# Patient Record
Sex: Male | Born: 1955 | Race: White | Hispanic: No | Marital: Married | Smoking: Never smoker
Health system: Southern US, Community
[De-identification: ages and names within clinical notes are randomized; demographics above are authoritative.]

## PROBLEM LIST (undated history)

## (undated) DIAGNOSIS — I2510 Atherosclerotic heart disease of native coronary artery without angina pectoris: Secondary | ICD-10-CM

## (undated) DIAGNOSIS — Z951 Presence of aortocoronary bypass graft: Secondary | ICD-10-CM

## (undated) DIAGNOSIS — I499 Cardiac arrhythmia, unspecified: Secondary | ICD-10-CM

## (undated) DIAGNOSIS — I10 Essential (primary) hypertension: Secondary | ICD-10-CM

## (undated) DIAGNOSIS — E785 Hyperlipidemia, unspecified: Secondary | ICD-10-CM

## (undated) DIAGNOSIS — E669 Obesity, unspecified: Secondary | ICD-10-CM

## (undated) HISTORY — DX: Obesity, unspecified: E66.9

## (undated) HISTORY — PX: CORONARY ARTERY BYPASS GRAFT: SHX141

## (undated) HISTORY — DX: Hyperlipidemia, unspecified: E78.5

## (undated) HISTORY — DX: Essential (primary) hypertension: I10

## (undated) HISTORY — DX: Cardiac arrhythmia, unspecified: I49.9

## (undated) HISTORY — PX: CORONARY ANGIOPLASTY: SHX604

## (undated) HISTORY — DX: Atherosclerotic heart disease of native coronary artery without angina pectoris: I25.10

---

## 2002-05-02 ENCOUNTER — Encounter: Payer: Self-pay | Admitting: Thoracic Surgery (Cardiothoracic Vascular Surgery)

## 2002-05-02 ENCOUNTER — Inpatient Hospital Stay (HOSPITAL_COMMUNITY): Admission: RE | Admit: 2002-05-02 | Discharge: 2002-05-07 | Payer: Self-pay | Admitting: Cardiovascular Disease

## 2002-05-03 ENCOUNTER — Encounter: Payer: Self-pay | Admitting: Thoracic Surgery (Cardiothoracic Vascular Surgery)

## 2002-05-04 ENCOUNTER — Encounter: Payer: Self-pay | Admitting: Thoracic Surgery (Cardiothoracic Vascular Surgery)

## 2009-01-23 ENCOUNTER — Observation Stay (HOSPITAL_COMMUNITY): Admission: AD | Admit: 2009-01-23 | Discharge: 2009-01-24 | Payer: Self-pay | Admitting: Cardiovascular Disease

## 2010-10-31 LAB — BASIC METABOLIC PANEL
BUN: 8 mg/dL (ref 6–23)
CO2: 28 mEq/L (ref 19–32)
Calcium: 9.2 mg/dL (ref 8.4–10.5)
Chloride: 99 mEq/L (ref 96–112)
Creatinine, Ser: 1.06 mg/dL (ref 0.4–1.5)
GFR calc Af Amer: >60 mL/min (ref >60)
GFR calc non Af Amer: >60 mL/min (ref >60)
Glucose, Bld: 94 mg/dL (ref 70–99)
Potassium: 4.2 mEq/L (ref 3.5–5.1)
Sodium: 137 mEq/L (ref 135–145)

## 2010-10-31 LAB — CBC
HCT: 46.6 % (ref 39.0–52.0)
Hemoglobin: 15.9 g/dL (ref 13.0–17.0)
MCHC: 34.2 g/dL (ref 30.0–36.0)
MCV: 92.8 fL (ref 78.0–100.0)
Platelets: 239 10*3/uL (ref 150–400)
RBC: 5.02 MIL/uL (ref 4.22–5.81)
RDW: 13.8 % (ref 11.5–15.5)
WBC: 8.4 10*3/uL (ref 4.0–10.5)

## 2010-11-19 ENCOUNTER — Other Ambulatory Visit: Payer: Self-pay | Admitting: *Deleted

## 2010-11-19 MED ORDER — METOPROLOL TARTRATE 50 MG PO TABS: ORAL_TABLET | ORAL | Status: DC

## 2010-11-19 NOTE — Telephone Encounter (Signed)
escribe medication per fax request  

## 2010-12-07 NOTE — H&P (Signed)
Lee White, Lee White                 ACCOUNT NO.:  [id number]   MEDICAL RECORD NO.:  [id number]          PATIENT TYPE:  OIB   LOCATION:                               FACILITY:  [facility]   PHYSICIAN:  Vesta Mixer, M.D. DATE OF BIRTH:  02/01/1956   DATE OF ADMISSION:  01/23/2009  DATE OF DISCHARGE:                              HISTORY & PHYSICAL   Lee White is a middle-aged gentleman with a history of coronary artery  disease.  He is status post coronary artery bypass grafting.  He is now  admitted to the hospital with episodes of chest discomfort and an  abnormal stress Cardiolite study.   Lee White has a history of coronary artery disease.  We performed a heart  catheterization on him in 2003.  He was found to have significant three-  vessel coronary artery disease and had coronary artery bypass grafting.   He presented to the office last week with episodes of chest pain and  chest fullness.  He complains of 3 weeks of intermittent episodes of  chest discomfort.  These episodes of chest discomfort seemed to be worse  with exertion and better with rest.  There is radiation out into both  arms.  The pain lasts for a few seconds and several minutes.  He has  also noted some increased shortness of breath.  He picked up bales of  hay over the past weekend and had lots of problems with that.  He has  not had any diaphoresis.  There is no syncope or presyncope.  He denies  any nausea or vomiting.  He denies any PND or orthopnea.  He denies any  heat or cold intolerance, weight gain or weight loss.   CURRENT MEDICATIONS:  Lopressor 25 mg a day, aspirin 325 mg a day,  Crestor 10 mg a day.   ALLERGIES:  None.   PAST MEDICAL HISTORY:  1. Status post coronary artery bypass grafting.  He had significant      three-vessel coronary artery disease.  2. Hyperlipidemia.  3. Hypertension.   SOCIAL HISTORY:  The patient is a nonsmoker.  He used to drink a lot of  alcohol but has not had anything  to drink in many years.  He is employed  as a Naval architect.   FAMILY HISTORY:  Father died at age 31 due to cancer.  His mother had a  CVA.   REVIEW OF SYSTEMS:  Reviewed in the HPI.  All other systems were  reviewed and are negative.   PHYSICAL EXAMINATION:  GENERAL:  He is a middle-aged gentleman in no  acute distress.  He is alert and oriented x3 and his mood and affect are  normal.  VITAL SIGNS:  His weight is 292.  His blood pressure is 134/84 with  heart rate of 78.  NECK:  Carotids, 2+.  He has no bruits, no JVD, no thyromegaly.  His  neck is supple.  HEENT:  Sclerae are nonicteric.  His mucous membranes are moist.  LUNGS:  Clear.  BACK:  Nontender.  HEART:  Regular rate.  S1 and S2.  His PMI is nondisplaced.  ABDOMEN:  Good bowel sounds.  There is no hepatosplenomegaly.  He is  moderately obese.  EXTREMITIES:  He has no clubbing, cyanosis, or edema.  His distal pulses  are 1+.  There is no rash or skin nodules.  There are no palpable cords.  His gait is normal.  NEUROLOGIC:  Nonfocal.   Lee White presents with episodes of chest discomfort.  He recently had a  stress Cardiolite study, which reveals lateral ischemia.  I have  recommended we proceed with heart catheterization with possible  angioplasty.  We have discussed the risks, benefits, and options of  heart catheterization.  He understands and agrees to proceed.      Vesta Mixer, M.D.  Electronically Signed     PJN/MEDQ  D:  01/19/2009  T:  01/20/2009  Job:  [id number]   cc:   Kathlee Nations

## 2010-12-07 NOTE — Cardiovascular Report (Signed)
NAMEDASAN, HARDMAN NO.:  [id number]   MEDICAL RECORD NO.:  [id number]          PATIENT TYPE:  INP   LOCATION:  2507                         FACILITY:  [facility]   PHYSICIAN:  Vesta Mixer, M.D. DATE OF BIRTH:  04-17-1956   DATE OF PROCEDURE:  01/23/2009  DATE OF DISCHARGE:                            CARDIAC CATHETERIZATION   Lee White is a 55 year old gentleman with a history of coronary artery  disease.  He is status post coronary artery bypass grafting.  He  recently presented with some episodes of chest discomfort.  A stress  Cardiolite study revealed a large lateral defect.  He was referred for  cardiac catheterization with possible PTCA for further evaluation.   The procedure was left heart catheterization with coronary angiography.   The right femoral artery was easily cannulated using modified Seldinger  technique.   HEMODYNAMICS:  LV pressure was 180/20.  The aortic pressure was 160/95.   Of note, it was not very difficult to cross the aortic valve.  We  measured a gradient of 20, although I am not sure that this is entirely  true.  We will need to relook of this with echocardiogram.   ANGIOGRAPHY:  Left main:  The left main is subtotal.  He had a 95%  stenosis at its original cath in 2003.  We were not able to fully engage  this, but visualized it with nonselective injections.   The right coronary is small.  It is occluded at its midpoint.   The saphenous vein graft to the right coronary artery is a nice graft.  The anastomosis is normal.  There was brisk flow down the right coronary  artery to the posterior descending artery.   The saphenous vein graft to the obtuse marginal artery #1 and then to  the obtuse marginal artery #2, has a 99% subtotal stenosis just at  anastomosis to the first obtuse marginal.  There was sluggish flow down  the distal aspect of the graft.   The left internal mammary artery was normal.  The anastomosis to the  LAD  is normal.  The distal LAD has minor luminal irregularities.   The left ventriculogram was performed in 30-degree RAO position.  It  reveals mild left ventricular enlargement.  There is mild left  ventricular dysfunction with an ejection fraction of 45-50%.   PCI.  The patient was given Angiomax.  The ACT was 302.  A 6-French  Judkins right 4 guide was positioned and engaged in the saphenous vein  graft to the obtuse marginal artery.  A Prowater angioplasty was placed  down the saphenous vein graft to the distal obtuse marginal artery.   Predilatation was achieved using a 3.0 x 15-mm apex.  We inflated it up  to 6 atmospheres for 18 seconds and then 8 atmospheres for 23 seconds.  Following this, a 3.0 x 15-mm XIENCE stent was positioned across the  stenosis and deployed at 12 atmospheres for 37 seconds.   Poststent dilatation was achieved using a 3.25 x 12-mm  Voyager.  It  was inflated up to 12  atmospheres for 37 seconds in the distal aspect of  the stent.  It was then pulled back to the middle of stent inflated up  to 14 atmospheres for 17 seconds.  This gave [place] a very nice angiographic  result with good apposition of the stent wall.   COMPLICATIONS:  None.   CONCLUSION:  1. Successful PTCA and stenting of the saphenous vein graft to the      obtuse marginal artery.  2. Mild left ventricular dysfunction.      Vesta Mixer, M.D.  Electronically Signed     PJN/MEDQ  D:  01/23/2009  T:  01/24/2009  Job:  [id number]

## 2010-12-10 NOTE — Discharge Summary (Signed)
Lee White, Lee White                           ACCOUNT NO.:  [id number]   MEDICAL RECORD NO.:  [id number]                   PATIENT TYPE:  INP   LOCATION:  2039                                 FACILITY:  [facility]   PHYSICIAN:  Salvatore Decent. Dorris Fetch, M.D.         DATE OF BIRTH:  04-Nov-1955   DATE OF ADMISSION:  05/02/2002  DATE OF DISCHARGE:  05/07/2002                                 DISCHARGE SUMMARY   ADMISSION DIAGNOSIS:  Coronary artery disease.   ADMISSION SECONDARY DIAGNOSES:  1. Hypertension.  2. Postoperative atrial fibrillation.   DISCHARGE DIAGNOSIS:  Coronary artery disease.   HOSPITAL COURSE:  [name] was admitted to [facility] on May 02, 2002, after undergoing emergent cardiac catheterization.  The patient was  found to have significant coronary artery disease.  Because of this, Dr.  Dorris Fetch was consulted and he took the patient emergently to the OR on  May 02, 2002, and performed a coronary bypass graft x4 with left internal  mammary artery anastomosis to left ascending artery, sequential saphenous  vein graft to the first and second obtuse marginal arteries, saphenous vein  graft  to the  posterior descending artery.  No complications were noted  during the procedure.  Postoperatively the patient's hospital course was  complicated by atrial fibrillation.  He was started on Cardizem and  amiodarone.  He converted back to normal sinus rhythm and did not require  any anticoagulation secondary to his atrial fibrillation.  He maintained in  normal sinus rhythm throughout the remainder of his postoperative course.  He had mild postop anemia secondary to blood loss which he tolerated well  and no transfusion was required.  His hemoglobin and hematocrit stabilized  at 10.9 and 33.1 with BUN and creatinine of 11 and 1.1.  He was subsequently  deemed stable for discharge home on postoperative day five, May 07, 2002.   MEDICATIONS AT THE TIME OF  DISCHARGE:  1. Ultram 50 mg one to two tabs every four to six hours as needed for pain.  2. Cardizem 240 mg one daily.  3. Amiodarone 200 mg one tablet twice daily.  4. Lanoxin 0.125 mg one daily.  5. Lopressor 50 mg one-half tablet every 12 hours.   ACTIVITY:  The patient was told no driving, strenuous activity, other than  the obvious case.  Told to walk daily and continue breathing exercises.   DISCHARGE DIET:  Low-fat, low-salt.   WOUND CARE:  The patient was told he could shower and clean his incisions  with soap and water.   DISPOSITION:  Home.    FOLLOW UP:  The patient was told to call his Cardiologist for two week  appointment date.  In addition, he is told to see Dr. Dorris Fetch at [facility]  office in three weeks.  The office will call him to verify time and date of  his appointment.  Nat Math, P.A.                      Salvatore Decent Dorris Fetch, M.D.    Henrene Hawking  D:  06/07/2002  T:  06/07/2002  Job:  [id number]   cc:   Vesta Mixer, M.D.   [facility] Office

## 2010-12-10 NOTE — Cardiovascular Report (Signed)
Lee White, Lee White                           ACCOUNT NO.:  [id number]   MEDICAL RECORD NO.:  [id number]                   PATIENT TYPE:  OIB   LOCATION:  2899                                 FACILITY:  [facility]   PHYSICIAN:  Vesta Mixer, M.D.              DATE OF BIRTH:  1955/11/18   DATE OF PROCEDURE:  05/02/2002  DATE OF DISCHARGE:                              CARDIAC CATHETERIZATION   INDICATIONS:  The patient is a 55 year old gentleman who presented to the  office yesterday with a history of hypertension and unstable angina. He was  referred for heart catheterization. His resting ECG reveals inferior T waves  consistent with a previous inferior wall myocardial infarction.   PROCEDURE:  Left heart catheterization with coronary angiography.   DESCRIPTION OF PROCEDURE:  The right femoral artery was easily cannulated  using the modified Seldinger technique.   HEMODYNAMICS:  The left ventricular pressure was 128/16, with an aortic  pressure 135/82.   ANGIOGRAPHY:  His left main angiography reveals a 99% stenosis at the  ostium. There was TIMI grade 2 flow down the LAD.  There was dampening of  the 6 [place] catheter.   The left anterior descending artery was seen tightly.  It appears to be a  moderate sized vessel.  There is a 70% stenosis in the proximal aspect of  the LAD. There is a medium sized diagonal which appears to be fairly patent  and appears to be a good target for bypass grafting.   The left circumflex artery is a fairly large vessel and is probably dominant  or codominant.  There are moderate irregularities in the circumflex artery.  There is a 50-60% stenosis followed by a 70% stenosis just prior to takeoff  of a posterior descending artery.  There is a moderate sized first obtuse  marginal artery which has only minor luminal irregularities.   The right coronary artery is occluded at its midpoint. The distal right  coronary artery was not visualized.   The  left subclavian artery is widely patent.  The left internal mammary  artery is patent.   LEFT VENTRICULOGRAM:  The left ventriculogram reveals overall well preserved  left ventricular systolic function.  He has akinesis of the basilar and mid  inferior walls.  The ejection fraction is approximately 55%.  The left  ventricular internal cavity has a spade shape, which is consistent with  hypertensive heart disease.  His left ventricular systolic function is  overall well preserved.   COMPLICATIONS:  None.   The patient started having some chest pain following the heart  catheterization. He was given IV labetalol and sublingual nitroglycerin.  His pain subsided.  We will admit him to the hospital and place him in CCU.  We will consult [facility] for consideration for coronary artery bypass grafting.  We will start him on some IV nitroglycerin.  Vesta Mixer, M.D.    PJN/MEDQ  D:  05/02/2002  T:  05/05/2002  Job:  [id number]   cc:   [facility] Office   Cardiac Catheterization Laboratory

## 2010-12-10 NOTE — Discharge Summary (Signed)
NAMECLAYBORNE, DIVIS                 ACCOUNT NO.:  [id number]   MEDICAL RECORD NO.:  [id number]          PATIENT TYPE:  INP   LOCATION:  2507                         FACILITY:  [facility]   PHYSICIAN:  Vesta Mixer, M.D. DATE OF BIRTH:  07/09/1956   DATE OF ADMISSION:  01/23/2009  DATE OF DISCHARGE:  01/24/2009                               DISCHARGE SUMMARY   DISCHARGE DIAGNOSES:  1. Coronary artery disease - status post coronary artery bypass      grafting with percutaneous transluminal coronary angioplasty and      stenting of the saphenous vein graft to the right coronary artery.  2. Hyperlipidemia.  3. Hypertension.   DISCHARGE MEDICATIONS:  1. Lopressor 25 mg twice a day.  2. Aspirin 81 mg a day.  3. Crestor 10 mg a day.  4. Plavix 75 mg a day.  5. Nitroglycerin as needed.   DISPOSITION:  The patient will see Dr. Elease Hashimoto in 1-2 weeks for followup  visit.   HISTORY:  Mr. Lee White is a middle-aged gentleman with a history of  coronary artery disease.  He is status post coronary artery bypass  grafting.  He was admitted to the hospital with episodes of chest pain  and an abnormal stress Cardiolite study.   HOSPITAL COURSE:  Chest pain.  The patient underwent heart  catheterization.  He was found to have a subtotal left main stenosis.  This stenosis has progressed from his original heart catheterization.   The right coronary artery is small.  It is occluded at its midpoint.  The saphenous vein graft to the posterior descending artery is a nice  graft.  The anastomosis is normal.  The saphenous vein graft to the  first and second obtuse marginal artery has a 99% stenosis in the  proximal segment.   The left internal mammary artery and the LAD is normal.   The left ventriculogram revealed mild left ventricular enlargement.  His  left ventricular systolic function was fairly well preserved.   The patient underwent PTCA and stenting of the saphenous vein graft to  the obtuse  marginal artery.  We placed a 3.0 x 15 mm Xience stent.  It  was deployed at 12 atmospheres.  Post-stent dilatation was achieved  using a 3.25-mm [place] Voyager, inflated up to 14 atmospheres.  The patient  did well and did not have any complications.  The patient was discharged  on the following day in stable condition.  All of his other medical  problems are stable.      Vesta Mixer, M.D.  Electronically Signed     Vesta Mixer, M.D.  Electronically Signed    PJN/MEDQ  D:  02/13/2009  T:  02/14/2009  Job:  [id number]

## 2010-12-10 NOTE — Op Note (Signed)
Lee White, Lee White                           ACCOUNT NO.:  [id number]   MEDICAL RECORD NO.:  [id number]                   PATIENT TYPE:  OIB   LOCATION:  2308                                 FACILITY:  [facility]   PHYSICIAN:  Salvatore Decent. Dorris Fetch, M.D.         DATE OF BIRTH:  [birth year]   DATE OF PROCEDURE:  05/02/2002  DATE OF DISCHARGE:                                 OPERATIVE REPORT   PREOPERATIVE DIAGNOSIS:  Severe left main and three vessel coronary disease  with unstable angina.   POSTOPERATIVE DIAGNOSIS:  Severe left main and three vessel coronary disease  with unstable angina.   OPERATION:  1. Mediastinotomy.  2. Extracorporeal circulation.  3. Coronary artery bypass grafting times four (left internal mammary artery     to left anterior descending artery, sequential saphenous vein graft to     obtuse marginal 1 and 2, saphenous vein graft to posterior descending).   SURGEON:  Salvatore Decent. Dorris Fetch, M.D.   ASSISTANT:  Levin Erp. Steward, P.A.   ANESTHESIA:  General   FINDINGS:  Small fair quality coronary targets.  Small caliber saphenous  vein and mammary artery.  Left ventricular hypertrophy.  Fibrous epicardial  reaction making localization of the coronaries difficult.  Diagonal too  small to graft.   CLINICAL NOTE:  The patient is a 55 year old gentleman with no previous  history of coronary disease.  Since Labor Day he has been having exertional  chest discomfort. Over the past several weeks he had noticed an increase in  frequency, duration and severity.  He was seen and evaluated by Dr. Elease Hashimoto  and scheduled for cardiac catheterization.  At cardiac catheterization, he  was found to have a totally occluded right coronary and a 99% ostial left  main stenosis.  Shortly following his catheterization, the patient had an  episode of chest pain requiring intravenous nitroglycerin.  He subsequently  stabilized but given the severity of his disease  the patient was  advised to  undergo emergent coronary artery bypass grafting.  He agreed to proceed with  surgery, understanding the risks involved.   DESCRIPTION OF PROCEDURE:  The patient was brought from the Catheterization  Laboratory Holding Area to the operating room.  There lines were placed to  monitor arterial, central venous and pulmonary artery pressure.  EKG leads  were placed for continuous monitoring.  The patient was anesthetized and  intubated.  A Foley catheter was placed.  Intravenous antibiotics were  administered.  The chest, abdomen and legs were prepped and draped in the  usual fashion.  A median sternotomy was performed and the left internal  mammary artery was harvested in the standard fashion.  Harvest of the left  mammary artery was difficult secondary to the patient's body habitus as well  as extensive mediastinal fat as well as fat attached to the parietal pleural  surface.  The mammary artery was relatively small but was a  good quality  vessel.  Simultaneously incision was made in the medial aspect of the right  leg at the knee and attempt was made to localize the greater saphenous vein  for endoscopic harvesting.  It could not be identified at that site,  therefore the vein was harvested using standard technique.  The vein was  relatively small caliber but was an acceptable conduit for grafting.  The  patient was fully heparinized prior to dividing the distal end of the  mammary artery.  There was good flow of blood through the cut end of the  vessel.   The pericardium was opened.  It was immediately noted that there was a dense  fibrous connective tissue on the epicardial surface of the heart,  essentially covering the entire heart, possibly due to remote pericarditis.  There were minimal adhesions within the pericardium and no significant  pericardial fluid.  The ascending aorta was inspected and palpated.  There  was no palpable atherosclerotic disease.  The aorta was  foreshortened  because of the patient's body habitus and a large amount of epicardial fat.  The aorta was cannulated in a standard fashion via 2-0 Ethibon nopledgetted  pursestring sutures.  A dual stage venous cannula was placed via pursestring  suture in the right atrial appendage.  Cardiopulmonary bypass was instituted  and the patient was cooled to 32 degrees Celsius.  Next, the coronary  arteries were dissected out.  This was difficult because of the fibrous  tissue overlying the epicardium.  The posterior descending, two obtuse  marginals and LAD were identified and felt to be suitable for grafting.  There was a diagonal vessel which was not suitable for grafting.  The  conduits were inspected and cut to length.  A foam pad was placed in the  pericardium to protect the left phrenic nerve.  A temperature probe was  placed in the myocardial septum and a Cardioplegia cannula was placed in the  ascending aorta.   The aorta was cross clamped, the left ventricle was emptied via the aortic  root.  Vent cardiac arrest then was achieved with a combination of cold  antegrade blood Cardioplegia and topical ice saline.  After achieving a  complete diastolic arrest and myocardial septal temperature of 12 degrees  Celsius, the following distal anastomoses were performed.   First, a reversed saphenous vein graft was placed end-to-side to the  posterior descending.  The posterior descending was a 1.5 mm fair quality  target.  The vein graft was small and of fair quality.  The anastomosis was  performed with a running 7-0 Prolene suture.  There was good flow through  the anastomosis.  Cardioplegia was administered.  There was a leak from the  side of the anastomosis which was repaired with a 7-0 Prolene suture.   Next, a reversed saphenous vein graft was placed sequentially to the first  and second obtuse marginal branches of the left circumflex coronary artery. These were the only two vessels in  the circumflex distribution that were  identifiable and graftable.  OM-1 was a lateral vessel and OM-2 was a  posterior lateral vessel.  Both were 1.5 mm in diameter.  Both were of fair  quality.  A side-to-side anastomosis was performed to OM-1 off a side branch  of the vein graft and end-to-side was performed to OM-2.  Both were done  using 7-0 Prolene sutures.  There was good flow through the graft.  Cardioplegia was administered.  There was good  hemostasis of both  anastomoses.   Next, the left internal mammary artery was brought through a window in the  pericardium.  The distal end was spatulated.  It was then anastomosed end-to-  side to the distal LAD.  The LAD was a 1.5 mm vessel at the site of the  anastomosis but only a 1 mm probe would pass distally.  The artery was  dissected out beyond the site of the anastomosis and this was due to  tapering of the vessel and not any significant atherosclerotic disease  distally.  The mammary was a 1.5 mm good quality conduit.  The anastomosis  was performed with a running 8-0 Prolene suture in an end-to-side fashion.  At the completion of the mammary to LAD anastomosis, the Bulldog clamp was  briefly removed to inspect for hemostasis.  Again there was a small leak  from the lateral aspect of the anastomosis which required an 8-0 Prolene  suture.  The Bulldog clamp was replaced.  Additional Cardioplegia was  administered.  The vein grafts were cut to length.   The Cardioplegia cannula was removed from the ascending aorta and the  proximal vein graft anastomoses were performed with 4.0 mm punch aortotomies  with running 6-0 Prolene sutures.  At the completion of the final proximal  anastomosis the patient was placed in Trendelenburg position.  The Bulldog  clamp was removed from the mammary artery.  Immediate mapping and septal  rewarming was noted. Lidocaine was administrated.  The aortic root was  deaired.  The aortic cross clamp was  removed.  The total cross clamp time  was 92 minutes.   The patient required a single defibrillation with 20 joules.  All proximal  and distal anastomoses were inspected and for hemostasis.  Epicardial pacing  wires were placed on the right ventricle and right atrium.  When the core  temperature had been rewarmed to 37 degrees Celsius a Dopamine drip was  initiated and the patient was weaned from cardiopulmonary bypass.  Total  bypass time was 147 minutes.   The initial cardiac index after coming off bypass was greater than 2 liters  per minute sqm and the patient remained hemodynamically stable throughout  the post bypass period.  A test dose of protamine was administered and was  well tolerated.  The atrial and aorta cannulae removed.  The remainder of  the protamine was administered without incident.  The chest was irrigated  with 1 liter of warm normal saline containing 1 gm of Vancomycin. Hemostasis was achieved.  A left pleural and two mediastinal chest tubes  were placed through separate subcostal incisions.  An attempt was made to  close the pericardium.  This was accompanied by a decrease in the blood  pressure and increase in the pulmonary artery pressures.  Pericardium was  then released and the hemodynamic changes resolved immediately and the  patient remained stable thereafter.  The mediastinal fat was reapproximated  over the heart to protect from adhesions directly to the sternum.  The  sternum was closed with a combination of single and double heavy gauge  interrupted stainless steel wire.  The pectoralis fascia was closed with a  running #1 Vicryl suture.  Subcutaneous tissue was closed with a running 2-0  Vicryl suture and the skin was closed with a 3-0 Vicryl suture.  All sponge,  needle and instrument counts were correct at the end of the procedure.  There were no intraoperative complications and the patient was taken from  the operating room to the surgical intensive  care unit intubated and in  stable condition.                                                 Salvatore Decent Dorris Fetch, M.D.    SCH/MEDQ  D:  05/02/2002  T:  05/03/2002  Job:  [id number]   cc:   Vesta Mixer, M.D.

## 2010-12-10 NOTE — H&P (Signed)
Lee White, Lee White                           ACCOUNT NO.:  [id number]   MEDICAL RECORD NO.:  [id number]                   PATIENT TYPE:  OIB   LOCATION:  2039                                 FACILITY:  [facility]   PHYSICIAN:  Vesta Mixer, M.D.              DATE OF BIRTH:  1956-06-03   DATE OF ADMISSION:  05/02/2002  DATE OF DISCHARGE:                                HISTORY & PHYSICAL   HISTORY AND PHYSICAL:  The patient is a 55 year old gentleman who was  admitted to the hospital for symptoms of a previous myocardial infarction  and unstable angina.   The patient is a 55 year old gentleman who has been in previously good  health.  He was diagnosed as having hypertension several months ago.   Approximately one month ago while he was at a large flea market in  [place], [place], the patient experienced severe chest pain while walking  up a hill.  The pain hurt for several hours, and he really did not feel well  all weekend.  Since that time, he has had recurrent episodes of chest pain  with any sort of exertion. The chest pain is described as a chest pain and  chest tightness.  It radiates across his chest and radiates down into his  arms.  He has also had some arm numbness.  The patient has not been able to  get out to do any sort of activity because of these pains.  He presents  today for further evaluation.   CURRENT MEDICATIONS:  Monopril 10 mg a day.   ALLERGIES:  No known drug allergies   PAST MEDICAL HISTORY:  1. Hypertension.  2. Chest pain for the past one month.   SOCIAL HISTORY:  The patient is a nonsmoker.  He used to drink a lot of  alcohol but has not had any thing to drink in 11 years.  He is self employed  as a Naval architect.   FAMILY HISTORY:  His father died at age 51 due to cancer.  His mother is 66  years old and had a CVA but is otherwise healthy.   REASON FOR ADMISSION:  Was reviewed and is essentially negative.   PHYSICAL EXAMINATION:  He is a  middle-aged gentleman in no acute distress.  He is alert and oriented x 3, and his mood and affect are normal.  VITAL SIGNS:  His weight is 264.  His blood pressure is 130/80 with a heart  rate of 76.  NECK:  Exam reveals 2+ carotids. He has no bruits.  He has no JVD, no  thyromegaly.  LUNGS:  Clear to auscultation.  HEART:  Regular rate, S1, S2.  He has a 1/6 systolic ejection murmur at the  left sternal border.  There is no S3 gallop.  ABDOMEN:  Good bowel sounds.  He is moderately obese.  He has no  hepatosplenomegaly.  EXTREMITIES:  No clubbing, cyanosis, or edema.  NEUROLOGIC:  Nonfocal.   LABORATORY DATA:  EKG reveals normal sinus rhythm.  He has inferior Q waves  consistent with a previous inferior wall myocardial infarction.  He has T  wave inversions in the lateral leads consistent with either a previous  subendocardial infarction or perhaps ischemia.   ASSESSMENT/PLAN:  At this time he presents with episodes of chest pain and  shortness of breath with any sort of exertion.  He was fairly comfortable at  rest. We will admit him for heart catheterization on 05/02/2002.  I have  given him samples of aspirin and Plavix to start taking today.  I've also  given him a prescription for nitroglycerin and told him to take one of these  under his tongue if he has any recurrent chest pains.  If he has severe  pain, he is to come to the hospital.  We have discussed the risks, benefits,  and options of heart catheterization.  He understands and agrees to proceed.                                                Vesta Mixer, M.D.    PJN/MEDQ  D:  05/01/2002  T:  05/04/2002  Job:  [id number]   cc:   Dr. Synthia Innocent

## 2011-02-18 ENCOUNTER — Other Ambulatory Visit: Payer: Self-pay | Admitting: *Deleted

## 2011-02-18 MED ORDER — CLOPIDOGREL BISULFATE 75 MG PO TABS: 75.0000 mg | ORAL_TABLET | Freq: Every day | ORAL | Status: DC

## 2011-02-18 NOTE — Telephone Encounter (Signed)
Pt called msg left, has been mailed 2 letters for need of ov, i called and left a msg that he needs ov hes not been seen for over 1.5 years. Dispensed 15 plavix.

## 2011-03-26 ENCOUNTER — Other Ambulatory Visit: Payer: Self-pay | Admitting: Cardiovascular Disease

## 2011-03-29 ENCOUNTER — Other Ambulatory Visit: Payer: Self-pay | Admitting: *Deleted

## 2011-03-29 MED ORDER — CLOPIDOGREL BISULFATE 75 MG PO TABS: 75.0000 mg | ORAL_TABLET | Freq: Every day | ORAL | Status: DC

## 2011-03-29 NOTE — Telephone Encounter (Signed)
Has yearly scheduled,

## 2011-04-11 ENCOUNTER — Encounter: Payer: Self-pay | Admitting: Cardiovascular Disease

## 2011-04-11 DIAGNOSIS — R079 Chest pain, unspecified: Secondary | ICD-10-CM | POA: Insufficient documentation

## 2011-04-11 DIAGNOSIS — E669 Obesity, unspecified: Secondary | ICD-10-CM | POA: Insufficient documentation

## 2011-04-13 ENCOUNTER — Encounter: Payer: Self-pay | Admitting: Cardiovascular Disease

## 2011-04-13 ENCOUNTER — Ambulatory Visit (INDEPENDENT_AMBULATORY_CARE_PROVIDER_SITE_OTHER): Payer: BC Managed Care – PPO | Admitting: Cardiovascular Disease

## 2011-04-13 DIAGNOSIS — R079 Chest pain, unspecified: Secondary | ICD-10-CM

## 2011-04-13 DIAGNOSIS — E669 Obesity, unspecified: Secondary | ICD-10-CM

## 2011-04-13 DIAGNOSIS — I2510 Atherosclerotic heart disease of native coronary artery without angina pectoris: Secondary | ICD-10-CM | POA: Insufficient documentation

## 2011-04-13 MED ORDER — CLOPIDOGREL BISULFATE 75 MG PO TABS: 75.0000 mg | ORAL_TABLET | Freq: Every day | ORAL | Status: DC

## 2011-04-13 NOTE — Assessment & Plan Note (Signed)
Time he is still fairly obese. We discussed the importance of losing weight.

## 2011-04-13 NOTE — Progress Notes (Signed)
Lee White Date of Birth  [birth year] [facility] [address]    [address] [place], [place]  [address] [phone]  Fax  [phone]  History of Present Illness:  55 yo history of coronary disease. He had any episodes of chest pain or shortness breath. We performed an angioplasty of the saphenous vein graft to the obtuse marginal  artery back in 2010. Since that time he has felt very well. He has not had any episodes of chest pain or shortness breath. He still was not getting much exercise and it is clear that he is not following any specific diet..     Current Outpatient Prescriptions on File Prior to Visit  Medication Sig Dispense Refill  . aspirin 325 MG tablet Take 325 mg by mouth daily. 1/2 daily        . clopidogrel (PLAVIX) 75 MG tablet Take 1 tablet (75 mg total) by mouth daily.  30 tablet  0  . metoprolol (LOPRESSOR) 50 MG tablet Takes 1/2 tablet twice a day  90 tablet  11  . rosuvastatin (CRESTOR) 10 MG tablet Take 10 mg by mouth daily.          No Known Allergies  Past Medical History  Diagnosis Date  . Hyperlipidemia   . Obesity   . Hypertension   . Chest pain   . Arrhythmia     post operative atrial fibrillation  . Coronary artery disease     Significant three vessel coronary artery disease    Past Surgical History  Procedure Date  . Coronary artery bypass graft     2002 4 grafts  . Coronary angioplasty     stent 2010 on graft    History  Smoking status  . Never Smoker   Smokeless tobacco  . Not on file    History  Alcohol Use No    Family History  Problem Relation Age of Onset  . Stroke Mother   . Cancer Father     Reviw of Systems:  Reviewed in the HPI.  All other systems are negative.  Physical Exam: BP 128/76  Pulse 60  Wt 286 lb (129.729 kg) The patient is alert and oriented x 3.  The mood and affect are normal.   Skin: warm and dry.  Color is normal.    HEENT:   the sclera are nonicteric.  The mucous membranes are  moist.  The carotids are 2+ without bruits.  There is no thyromegaly.  There is no JVD.    Lungs: clear.  The chest wall is non tender.    Heart: regular rate with a normal S1 and S2.  There are no murmurs, gallops, or rubs. The PMI is not displaced.     Abdomen: good bowel sounds.  There is no guarding or rebound.  There is no hepatosplenomegaly or tenderness.  There are no masses.   Extremities:  no clubbing, cyanosis, or edema.  The legs are without rashes.  The distal pulses are intact.   Neuro:  Cranial nerves II - XII are intact.  Motor and sensory functions are intact.    The gait is normal.  ECG: Normal sinus rhythm. He has T-wave inversions in the lateral leads. She he has an old inferior wall myocardial infarction.  Assessment / Plan:

## 2011-04-13 NOTE — Assessment & Plan Note (Addendum)
He's doing quite well from a cardiac standpoint. He's not had any episodes of chest pain or shortness breath. I've encouraged him to follow a good diet and I have encouraged him to lose weight.  We'll check a lipid profile when I see him again in one year

## 2011-11-24 ENCOUNTER — Other Ambulatory Visit: Payer: Self-pay | Admitting: Cardiovascular Disease

## 2011-11-25 NOTE — Telephone Encounter (Signed)
Fax Received. Refill Completed. Lee White (R.M.A)   

## 2012-03-20 ENCOUNTER — Other Ambulatory Visit: Payer: Self-pay | Admitting: Cardiovascular Disease

## 2012-03-20 NOTE — Telephone Encounter (Signed)
Pt needs appointment then refill can be made Fax Received. Refill Completed. Paislie Tessler Chowoe (R.M.A)   

## 2012-07-06 ENCOUNTER — Other Ambulatory Visit: Payer: Self-pay | Admitting: *Deleted

## 2012-07-06 NOTE — Telephone Encounter (Signed)
Patient need to appointment

## 2013-04-01 ENCOUNTER — Other Ambulatory Visit: Payer: Self-pay

## 2013-04-01 ENCOUNTER — Encounter (HOSPITAL_COMMUNITY): Admission: EM | Disposition: E | Payer: Self-pay | Source: Other Acute Inpatient Hospital | Attending: Pulmonary Disease

## 2013-04-01 ENCOUNTER — Encounter (HOSPITAL_COMMUNITY): Payer: Self-pay | Admitting: Physician Assistant

## 2013-04-01 ENCOUNTER — Inpatient Hospital Stay (HOSPITAL_COMMUNITY): Payer: BC Managed Care – PPO

## 2013-04-01 ENCOUNTER — Inpatient Hospital Stay (HOSPITAL_COMMUNITY)
Admission: EM | Admit: 2013-04-01 | Discharge: 2013-04-24 | DRG: 123 | Disposition: E | Payer: BC Managed Care – PPO | Source: Other Acute Inpatient Hospital | Attending: Pulmonary Disease | Admitting: Pulmonary Disease

## 2013-04-01 DIAGNOSIS — R403 Persistent vegetative state: Secondary | ICD-10-CM | POA: Diagnosis present

## 2013-04-01 DIAGNOSIS — R570 Cardiogenic shock: Secondary | ICD-10-CM

## 2013-04-01 DIAGNOSIS — E872 Acidosis, unspecified: Secondary | ICD-10-CM

## 2013-04-01 DIAGNOSIS — J811 Chronic pulmonary edema: Secondary | ICD-10-CM

## 2013-04-01 DIAGNOSIS — I25810 Atherosclerosis of coronary artery bypass graft(s) without angina pectoris: Secondary | ICD-10-CM

## 2013-04-01 DIAGNOSIS — I2582 Chronic total occlusion of coronary artery: Secondary | ICD-10-CM | POA: Diagnosis present

## 2013-04-01 DIAGNOSIS — Z515 Encounter for palliative care: Secondary | ICD-10-CM

## 2013-04-01 DIAGNOSIS — E874 Mixed disorder of acid-base balance: Secondary | ICD-10-CM | POA: Diagnosis present

## 2013-04-01 DIAGNOSIS — J9600 Acute respiratory failure, unspecified whether with hypoxia or hypercapnia: Secondary | ICD-10-CM | POA: Diagnosis present

## 2013-04-01 DIAGNOSIS — T17800D Unspecified foreign body in other parts of respiratory tract causing asphyxiation, subsequent encounter: Secondary | ICD-10-CM

## 2013-04-01 DIAGNOSIS — R7309 Other abnormal glucose: Secondary | ICD-10-CM | POA: Diagnosis present

## 2013-04-01 DIAGNOSIS — R4020 Unspecified coma: Secondary | ICD-10-CM | POA: Diagnosis not present

## 2013-04-01 DIAGNOSIS — E785 Hyperlipidemia, unspecified: Secondary | ICD-10-CM

## 2013-04-01 DIAGNOSIS — R739 Hyperglycemia, unspecified: Secondary | ICD-10-CM

## 2013-04-01 DIAGNOSIS — G931 Anoxic brain damage, not elsewhere classified: Secondary | ICD-10-CM

## 2013-04-01 DIAGNOSIS — I2510 Atherosclerotic heart disease of native coronary artery without angina pectoris: Secondary | ICD-10-CM | POA: Diagnosis present

## 2013-04-01 DIAGNOSIS — K7200 Acute and subacute hepatic failure without coma: Secondary | ICD-10-CM

## 2013-04-01 DIAGNOSIS — R34 Anuria and oliguria: Secondary | ICD-10-CM

## 2013-04-01 DIAGNOSIS — I255 Ischemic cardiomyopathy: Secondary | ICD-10-CM

## 2013-04-01 DIAGNOSIS — I10 Essential (primary) hypertension: Secondary | ICD-10-CM

## 2013-04-01 DIAGNOSIS — T17800A Unspecified foreign body in other parts of respiratory tract causing asphyxiation, initial encounter: Secondary | ICD-10-CM

## 2013-04-01 DIAGNOSIS — I2589 Other forms of chronic ischemic heart disease: Secondary | ICD-10-CM | POA: Diagnosis present

## 2013-04-01 DIAGNOSIS — Z66 Do not resuscitate: Secondary | ICD-10-CM | POA: Diagnosis not present

## 2013-04-01 DIAGNOSIS — I4901 Ventricular fibrillation: Secondary | ICD-10-CM | POA: Diagnosis present

## 2013-04-01 DIAGNOSIS — I2119 ST elevation (STEMI) myocardial infarction involving other coronary artery of inferior wall: Principal | ICD-10-CM | POA: Diagnosis present

## 2013-04-01 DIAGNOSIS — I4891 Unspecified atrial fibrillation: Secondary | ICD-10-CM | POA: Diagnosis present

## 2013-04-01 DIAGNOSIS — N179 Acute kidney failure, unspecified: Secondary | ICD-10-CM | POA: Diagnosis present

## 2013-04-01 DIAGNOSIS — G9340 Encephalopathy, unspecified: Secondary | ICD-10-CM

## 2013-04-01 DIAGNOSIS — J690 Pneumonitis due to inhalation of food and vomit: Secondary | ICD-10-CM | POA: Diagnosis present

## 2013-04-01 DIAGNOSIS — E876 Hypokalemia: Secondary | ICD-10-CM | POA: Diagnosis not present

## 2013-04-01 DIAGNOSIS — I469 Cardiac arrest, cause unspecified: Secondary | ICD-10-CM

## 2013-04-01 DIAGNOSIS — Z6841 Body Mass Index (BMI) 40.0 and over, adult: Secondary | ICD-10-CM

## 2013-04-01 DIAGNOSIS — J9601 Acute respiratory failure with hypoxia: Secondary | ICD-10-CM

## 2013-04-01 DIAGNOSIS — Z9861 Coronary angioplasty status: Secondary | ICD-10-CM

## 2013-04-01 DIAGNOSIS — G4730 Sleep apnea, unspecified: Secondary | ICD-10-CM | POA: Diagnosis present

## 2013-04-01 HISTORY — DX: Presence of aortocoronary bypass graft: Z95.1

## 2013-04-01 HISTORY — DX: Essential (primary) hypertension: I10

## 2013-04-01 HISTORY — PX: LEFT HEART CATHETERIZATION WITH CORONARY ANGIOGRAM: SHX5451

## 2013-04-01 HISTORY — DX: Hyperlipidemia, unspecified: E78.5

## 2013-04-01 LAB — PROTIME-INR
INR: 1.29 (ref 0.00–1.49)
Prothrombin Time: 15.8 seconds — ABNORMAL HIGH (ref 11.6–15.2)

## 2013-04-01 LAB — COMPREHENSIVE METABOLIC PANEL
AST: 109 U/L — ABNORMAL HIGH (ref 0–37)
BUN: 14 mg/dL (ref 6–23)
CO2: 25 mEq/L (ref 19–32)
Chloride: 96 mEq/L (ref 96–112)
Creatinine, Ser: 1.84 mg/dL — ABNORMAL HIGH (ref 0.50–1.35)
GFR calc non Af Amer: 39 mL/min — ABNORMAL LOW (ref >90)
Total Bilirubin: 0.6 mg/dL (ref 0.3–1.2)

## 2013-04-01 LAB — CBC
HCT: 49.6 % (ref 39.0–52.0)
MCV: 93.9 fL (ref 78.0–100.0)
Platelets: 235 10*3/uL (ref 150–400)
RBC: 5.28 MIL/uL (ref 4.22–5.81)
WBC: 26.1 10*3/uL — ABNORMAL HIGH (ref 4.0–10.5)

## 2013-04-01 LAB — POCT I-STAT 3, ART BLOOD GAS (G3+)
Acid-base deficit: 2 mmol/L (ref 0.0–2.0)
O2 Saturation: 100 %
pCO2 arterial: 50.2 mmHg — ABNORMAL HIGH (ref 35.0–45.0)

## 2013-04-01 LAB — POCT I-STAT, CHEM 8
Calcium, Ion: 1.06 mmol/L — ABNORMAL LOW (ref 1.12–1.23)
Chloride: 102 mEq/L (ref 96–112)
HCT: 55 % — ABNORMAL HIGH (ref 39.0–52.0)
Hemoglobin: 18.7 g/dL — ABNORMAL HIGH (ref 13.0–17.0)
TCO2: 26 mmol/L (ref 0–100)

## 2013-04-01 LAB — URINALYSIS, ROUTINE W REFLEX MICROSCOPIC
Bilirubin Urine: NEGATIVE
Nitrite: NEGATIVE
Specific Gravity, Urine: 1.025 (ref 1.005–1.030)
Urobilinogen, UA: 1 mg/dL (ref 0.0–1.0)
pH: 6 (ref 5.0–8.0)

## 2013-04-01 LAB — BASIC METABOLIC PANEL
Chloride: 97 mEq/L (ref 96–112)
GFR calc Af Amer: 48 mL/min — ABNORMAL LOW (ref >90)
GFR calc Af Amer: 52 mL/min — ABNORMAL LOW (ref >90)
GFR calc non Af Amer: 42 mL/min — ABNORMAL LOW (ref >90)
Glucose, Bld: 223 mg/dL — ABNORMAL HIGH (ref 70–99)
Potassium: 5.1 mEq/L (ref 3.5–5.1)
Potassium: 4.2 mEq/L (ref 3.5–5.1)
Sodium: 133 mEq/L — ABNORMAL LOW (ref 135–145)
Sodium: 134 mEq/L — ABNORMAL LOW (ref 135–145)

## 2013-04-01 LAB — URINE MICROSCOPIC-ADD ON

## 2013-04-01 LAB — GLUCOSE, CAPILLARY
Glucose-Capillary: 192 mg/dL — ABNORMAL HIGH (ref 70–99)
Glucose-Capillary: 188 mg/dL — ABNORMAL HIGH (ref 70–99)

## 2013-04-01 LAB — CK TOTAL AND CKMB (NOT AT ARMC)
Relative Index: 2.3 (ref 0.0–2.5)
Total CK: 956 U/L — ABNORMAL HIGH (ref 7–232)

## 2013-04-01 LAB — RAPID URINE DRUG SCREEN, HOSP PERFORMED
Barbiturates: NOT DETECTED
Benzodiazepines: NOT DETECTED
Cocaine: NOT DETECTED
Opiates: NOT DETECTED

## 2013-04-01 LAB — APTT: aPTT: 29 seconds (ref 24–37)

## 2013-04-01 LAB — TROPONIN I: Troponin I: 1.58 ng/mL (ref <0.30)

## 2013-04-01 LAB — MRSA PCR SCREENING: MRSA by PCR: NEGATIVE

## 2013-04-01 SURGERY — LEFT HEART CATHETERIZATION WITH CORONARY ANGIOGRAM
Anesthesia: LOCAL

## 2013-04-01 MED ORDER — ACETAMINOPHEN 325 MG PO TABS: 650.0000 mg | ORAL_TABLET | ORAL | Status: DC | PRN

## 2013-04-01 MED ORDER — FENTANYL BOLUS VIA INFUSION
50.0000 ug | INTRAVENOUS | Status: DC | PRN
  Filled 2013-04-01: qty 50

## 2013-04-01 MED ORDER — HEPARIN SODIUM (PORCINE) 5000 UNIT/ML IJ SOLN
5000.0000 [IU] | Freq: Three times a day (TID) | INTRAMUSCULAR | Status: DC
  Filled 2013-04-01 (×3): qty 1

## 2013-04-01 MED ORDER — SODIUM CHLORIDE 0.9 % IV SOLN
1.0000 mL/kg/h | INTRAVENOUS | Status: AC
  Administered 2013-04-01: 1 mL/kg/h via INTRAVENOUS

## 2013-04-01 MED ORDER — FENTANYL CITRATE 0.05 MG/ML IJ SOLN
25.0000 ug/h | INTRAMUSCULAR | Status: DC
  Administered 2013-04-01 – 2013-04-03 (×4): 200 ug/h via INTRAVENOUS
  Filled 2013-04-01 (×5): qty 50

## 2013-04-01 MED ORDER — NOREPINEPHRINE BITARTRATE 1 MG/ML IJ SOLN
0.5000 ug/min | INTRAVENOUS | Status: DC
  Administered 2013-04-01: 5 ug/min via INTRAVENOUS
  Filled 2013-04-01: qty 4

## 2013-04-01 MED ORDER — BIOTENE DRY MOUTH MT LIQD
15.0000 mL | Freq: Four times a day (QID) | OROMUCOSAL | Status: DC
  Administered 2013-04-02 – 2013-04-05 (×13): 15 mL via OROMUCOSAL

## 2013-04-01 MED ORDER — ASPIRIN 81 MG PO CHEW
81.0000 mg | CHEWABLE_TABLET | Freq: Every day | ORAL | Status: DC
  Administered 2013-04-02 – 2013-04-04 (×3): 81 mg via ORAL
  Filled 2013-04-01 (×3): qty 1

## 2013-04-01 MED ORDER — CHLORHEXIDINE GLUCONATE 0.12 % MT SOLN
OROMUCOSAL | Status: AC
  Administered 2013-04-01: 15 mL
  Filled 2013-04-01: qty 15

## 2013-04-01 MED ORDER — CHLORHEXIDINE GLUCONATE 0.12 % MT SOLN
15.0000 mL | Freq: Two times a day (BID) | OROMUCOSAL | Status: DC
  Administered 2013-04-02 – 2013-04-05 (×7): 15 mL via OROMUCOSAL
  Filled 2013-04-01 (×7): qty 15

## 2013-04-01 MED ORDER — NOREPINEPHRINE BITARTRATE 1 MG/ML IJ SOLN
0.5000 ug/min | INTRAVENOUS | Status: DC
  Administered 2013-04-01: 25 ug/min via INTRAVENOUS
  Administered 2013-04-02: 18 ug/min via INTRAVENOUS
  Administered 2013-04-03: 16 ug/min via INTRAVENOUS
  Filled 2013-04-01 (×3): qty 16

## 2013-04-01 MED ORDER — MIDAZOLAM HCL 5 MG/ML IJ SOLN: 2.0000 mg | Freq: Once | INTRAMUSCULAR | Status: AC | PRN

## 2013-04-01 MED ORDER — CISATRACURIUM BOLUS VIA INFUSION
0.0500 mg/kg | Freq: Once | INTRAVENOUS | Status: AC | PRN
  Filled 2013-04-01: qty 8

## 2013-04-01 MED ORDER — NITROGLYCERIN 0.2 MG/ML ON CALL CATH LAB
INTRAVENOUS | Status: AC
  Filled 2013-04-01: qty 1

## 2013-04-01 MED ORDER — ASPIRIN 300 MG RE SUPP
300.0000 mg | RECTAL | Status: AC
  Administered 2013-04-01: 300 mg via RECTAL
  Filled 2013-04-01: qty 1

## 2013-04-01 MED ORDER — PANTOPRAZOLE SODIUM 40 MG IV SOLR
40.0000 mg | INTRAVENOUS | Status: DC
  Administered 2013-04-01 – 2013-04-04 (×4): 40 mg via INTRAVENOUS
  Filled 2013-04-01 (×5): qty 40

## 2013-04-01 MED ORDER — MIDAZOLAM BOLUS VIA INFUSION
2.0000 mg | INTRAVENOUS | Status: DC | PRN
  Filled 2013-04-01: qty 2

## 2013-04-01 MED ORDER — SODIUM CHLORIDE 0.9 % IJ SOLN: 3.0000 mL | INTRAMUSCULAR | Status: DC | PRN

## 2013-04-01 MED ORDER — SODIUM CHLORIDE 0.9 % IJ SOLN
3.0000 mL | Freq: Two times a day (BID) | INTRAMUSCULAR | Status: DC
  Administered 2013-04-02 (×2): 3 mL via INTRAVENOUS
  Administered 2013-04-03: 5 mL via INTRAVENOUS
  Administered 2013-04-03 – 2013-04-04 (×3): 3 mL via INTRAVENOUS

## 2013-04-01 MED ORDER — SODIUM CHLORIDE 0.9 % IV SOLN
1.0000 mg/h | INTRAVENOUS | Status: DC
  Administered 2013-04-01 – 2013-04-03 (×4): 4 mg/h via INTRAVENOUS
  Filled 2013-04-01 (×4): qty 10

## 2013-04-01 MED ORDER — SODIUM CHLORIDE 0.9 % IV SOLN: INTRAVENOUS | Status: DC

## 2013-04-01 MED ORDER — CLOPIDOGREL BISULFATE 75 MG PO TABS
75.0000 mg | ORAL_TABLET | Freq: Every day | ORAL | Status: DC
  Administered 2013-04-02 – 2013-04-05 (×4): 75 mg via ORAL
  Filled 2013-04-01 (×5): qty 1

## 2013-04-01 MED ORDER — HEPARIN (PORCINE) IN NACL 100-0.45 UNIT/ML-% IJ SOLN
1700.0000 [IU]/h | INTRAMUSCULAR | Status: DC
  Administered 2013-04-01 – 2013-04-03 (×3): 1000 [IU]/h via INTRAVENOUS
  Administered 2013-04-04: 1300 [IU]/h via INTRAVENOUS
  Filled 2013-04-01 (×5): qty 250

## 2013-04-01 MED ORDER — CISATRACURIUM BOLUS VIA INFUSION
0.1000 mg/kg | Freq: Once | INTRAVENOUS | Status: AC
  Administered 2013-04-01: 14.5 mg via INTRAVENOUS
  Filled 2013-04-01: qty 15

## 2013-04-01 MED ORDER — INSULIN ASPART 100 UNIT/ML ~~LOC~~ SOLN
0.0000 [IU] | SUBCUTANEOUS | Status: DC
  Administered 2013-04-01: 1 [IU] via SUBCUTANEOUS
  Administered 2013-04-02: 3 [IU] via SUBCUTANEOUS
  Administered 2013-04-02: 2 [IU] via SUBCUTANEOUS

## 2013-04-01 MED ORDER — SODIUM CHLORIDE 0.9 % IV SOLN
1.5000 g | Freq: Four times a day (QID) | INTRAVENOUS | Status: DC
  Administered 2013-04-02 – 2013-04-05 (×14): 1.5 g via INTRAVENOUS
  Filled 2013-04-01 (×17): qty 1.5

## 2013-04-01 MED ORDER — ONDANSETRON HCL 4 MG/2ML IJ SOLN: 4.0000 mg | Freq: Four times a day (QID) | INTRAMUSCULAR | Status: DC | PRN

## 2013-04-01 MED ORDER — FENTANYL CITRATE 0.05 MG/ML IJ SOLN: 100.0000 ug | Freq: Once | INTRAMUSCULAR | Status: AC | PRN

## 2013-04-01 MED ORDER — SODIUM BICARBONATE 8.4 % IV SOLN
INTRAVENOUS | Status: DC
  Administered 2013-04-01 – 2013-04-02 (×3): via INTRAVENOUS
  Filled 2013-04-01 (×8): qty 150

## 2013-04-01 MED ORDER — SODIUM CHLORIDE 0.9 % IV SOLN
2000.0000 mL | Freq: Once | INTRAVENOUS | Status: AC
  Administered 2013-04-01: 2000 mL via INTRAVENOUS

## 2013-04-01 MED ORDER — HEPARIN (PORCINE) IN NACL 2-0.9 UNIT/ML-% IJ SOLN
INTRAMUSCULAR | Status: AC
  Filled 2013-04-01: qty 1000

## 2013-04-01 MED ORDER — SODIUM CHLORIDE 0.9 % IV SOLN: 250.0000 mL | INTRAVENOUS | Status: DC | PRN

## 2013-04-01 MED ORDER — SODIUM CHLORIDE 0.9 % IV SOLN
1.0000 ug/kg/min | INTRAVENOUS | Status: DC
  Administered 2013-04-01 – 2013-04-02 (×2): 1 ug/kg/min via INTRAVENOUS
  Filled 2013-04-01 (×2): qty 20

## 2013-04-01 MED ORDER — ARTIFICIAL TEARS OP OINT
1.0000 "application " | TOPICAL_OINTMENT | Freq: Three times a day (TID) | OPHTHALMIC | Status: DC
  Administered 2013-04-01 – 2013-04-03 (×5): 1 via OPHTHALMIC
  Filled 2013-04-01: qty 3.5

## 2013-04-01 MED ORDER — LIDOCAINE HCL (PF) 1 % IJ SOLN
INTRAMUSCULAR | Status: AC
  Filled 2013-04-01: qty 30

## 2013-04-01 MED ORDER — ALBUTEROL SULFATE (5 MG/ML) 0.5% IN NEBU
2.5000 mg | INHALATION_SOLUTION | RESPIRATORY_TRACT | Status: DC | PRN
  Administered 2013-04-03: 2.5 mg via RESPIRATORY_TRACT

## 2013-04-01 MED ORDER — FENTANYL CITRATE 0.05 MG/ML IJ SOLN
100.0000 ug | Freq: Once | INTRAMUSCULAR | Status: AC
  Administered 2013-04-01: 100 ug via INTRAVENOUS

## 2013-04-01 MED ORDER — MIDAZOLAM HCL 5 MG/ML IJ SOLN
2.0000 mg | Freq: Once | INTRAMUSCULAR | Status: AC
  Administered 2013-04-01: 2 mg via INTRAVENOUS

## 2013-04-01 NOTE — CV Procedure (Signed)
   Cardiac Catheterization Procedure Note  Name: Lee White MRN: [id number] DOB: Sep 25, 1955  Procedure: Left Heart Cath, Selective Coronary Angiography, LV angiography, LIMA angiography, SVG angiography  Indication: out-of-hospital cardiac arrest, inferior MI   Procedural details: The right groin was prepped, draped, and anesthetized with 1% lidocaine. Using modified Seldinger technique, a 5 French sheath was introduced into the right femoral artery and a 6 Fr sheath was introduced into the RFV. Standard Judkins catheters were used for coronary angiography and left ventriculography. Catheter exchanges were performed over a guidewire. There were no immediate procedural complications. The patient was transferred to the post catheterization recovery area for further monitoring.  Procedural Findings: Hemodynamics:  AO 110/80 LV 109/25   Coronary angiography: Coronary dominance: right  Left mainstem: 100% occlusion  Left anterior descending (LAD): fills from LIMA  Left circumflex (LCx): total occlusion, fills from SVG  Right coronary artery (RCA): 100% occlusion  SVG-RCA: 100% occlusion just beyond aortic anastomosis  SVG - sequential to OM1 and OM2: 95% eccentric stenosis between the distal anastomoses (distal body of graft)  LIMA-LAD: widely patent. Retrograde filling of proximal LAD  Left ventriculography: Severe global LV dysfunction LVEF 20%  Final Conclusions:   1. Severe native 3 vessel disease with total occlusion of the left main and RCA 2. Severe stenosis of the SVG - OM1-OM2 3. Total occlusion of the RCA 4. Patent LIMA-LAD 5. Severe LV dysfunction  Recommendations: supportive care. Consideration of SVG-OM if he recovers neurologically. Will follow closely with you.  Tonny Bollman 03/29/2013, 6:05 PM

## 2013-04-01 NOTE — Consult Note (Signed)
CARDIOLOGY CONSULT NOTE  Patient ID: Lee White MRN: [id number], DOB/AGE: 56/10/1956 56 y.o.  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 He   He became unrsponsive, pulseless.  CPR begun   When EMS arrived he was shocked 3 times  Developed asystole.  Received medications per protocol. He was taken to [facility] and was transferred to [facility] for further evaluation and treatment. He was intubated prior to arrival. He is currently intubated and sedated. Lines being placed for cooling.    Per the patient's wife he had been feeling good  Was active this weekend.  No CP  Breathing OK  Follows with his primary doctor.  Last seen by P Nahser in 2012   Past Medical History:  HTN CAD Obesity Hyperlipidemia  Surgical History:   CABG X 4 2003 LIMA-LAD, SVG-OM1-OM2, SVG-PDA  CATH 2010: Lmain 99%, RCA 100%, LIMA-LAD OK, SVG-RCA OK, SVG-OM1-OM2 99% at anastamosis to OM1, Rx with 3.0x15 mm XIENCE stent, EF 45-50%.  Inpatient Medications:  PENDING  Allergies: NKDA  History   Social History  . Marital Status: Married    Spouse Name: N/A    Number of Children: N/A  . Years of Education: N/A   Occupational History  . Not on file.   Social History Main Topics  . Smoking status: Never Smoker   . Smokeless tobacco: Not on file  . Alcohol Use: No     Comment: Quit in 1992  . Drug Use: No  . Sexual Activity: Not on file   Other Topics Concern  . Not on file   Social History Narrative  . No narrative on file     Family History  Problem Relation Age of Onset  . Cancer Father   . Stroke Mother      Review of Systems: Not obtainable secondary to pt condition.  Labs: none here yet  Radiology/Studies: pending  EKG: SR  ST elevation (1  mm) III, AVF  With lateral ST depression.    Physical Exam: Blood pressure 82/40, pulse 122, temperature 100.6 F (38.1 C), temperature source Core (Comment), resp. rate 24, weight 320 lb (145.151 kg), SpO2 96.00%.   General: Well developed, obese male, intubated and sedated. Head: Normocephalic, atraumatic, sclera non-icteric, no xanthomas, nares are without discharge.  Neck: Negative for carotid bruits. Unable to assess JVP  secondary to pt size and equipment Lungs:  Heart: RRR with S1 S2. No murmurs, rubs, or gallops appreciated. Abdomen: Soft, non-tender, non-distended with normoactive bowel sounds. No hepatomegaly. No rebound/guarding. No obvious abdominal masses. Msk:  Strength could not be assessed.  Extremities: No clubbing, cyanosis or edema.  Distal pedal pulses are 2+ and equal bilaterally. Neuro: unresponsive due to sedation Psych:  unresponsive due to sedation   ASSESSMENT AND PLAN:   Patient is a 57 yo with known CAD  S/p witnessed cardiac arrest  Prolonged CPR  Now intubated  Cooling being initiated.  Patient is acidotic.  BP is tenuous.  EKG with ST elevation inferiorly.  Plan to proceed with L heart cath to define anatomy Discussed plans, patient's condition with his wife.  OVerall prognosis is guarded.    CCM to manage vent, cooling protocol.    Bobbye Riggs Barrett 04/07/2013, 4:58 PM  Patient examined  I have amended note to reflect my findings.    Dietrich Pates April 23, 2013

## 2013-04-01 NOTE — Progress Notes (Signed)
ANTICOAGULATION CONSULT NOTE - Initial Consult  Pharmacy Consult for Heparin Indication: chest pain/ACS  Allergies  Allergen Reactions  . Other Hives and Swelling    "Unknown medication from 30 years ago.  He has had various IV and Pain medications from previous performed procedures with no problems.  Given benadryl to counteract reaction-per patient's family."    Patient Measurements: Height: 5\' 10"  (177.8 cm) Weight: 320 lb (145.151 kg) IBW/kg (Calculated) : 73 Heparin Dosing Weight: 107  Vital Signs: Temp: 100.6 F (38.1 C) (09/08 1645) Temp src: Core (Comment) (09/08 1615) BP: 81/60 mmHg (09/08 1700) Pulse Rate: 117 (09/08 1700)  Labs:  Recent Labs  04/08/2013 1735 04/22/2013 1740  HGB 17.1* 18.7*  HCT 49.6 55.0*  PLT 235  --   CREATININE  --  1.60*    Estimated Creatinine Clearance: 74.3 ml/min (by C-G formula based on Cr of 1.6).   Medical History: Past Medical History  Diagnosis Date  . S/P CABG (coronary artery bypass graft)   . HTN (hypertension)   . HLD (hyperlipidemia)     Medications:  Prescriptions prior to admission  Medication Sig Dispense Refill  . aspirin 81 MG EC tablet Take 81 mg by mouth every morning. Swallow whole.      . clopidogrel (PLAVIX) 75 MG tablet Take 75 mg by mouth every morning.       . liver oil-zinc oxide (DESITIN) 40 % ointment Apply 1 application topically daily as needed for dry skin (for rash).      . metoprolol tartrate (LOPRESSOR) 25 MG tablet Take 25 mg by mouth every morning.       . rosuvastatin (CRESTOR) 10 MG tablet Take 10 mg by mouth every morning.         Assessment: 57 yo M with history CABG admitted 04/09/2013  On transfer from [facility] post cardiac arrest (>40 min). Pharmacy consulted to start heparin.    Events: s/p cath initiating hypothermia  Coag: ACS, will begin heparin with no bolus  ID: empiric aspiration PNA  CV: CAD, EF 20%: ASA, plavix  Goal of Therapy:  Heparin level 0.3-0.7  units/ml Monitor platelets by anticoagulation protocol: Yes   Plan:  Start heparin infusion at 1000 units/hr, in setting of hypothermia Check anti-Xa level in 8 hours and daily while on heparin Continue to monitor H&H and platelets   Thank you for allowing pharmacy to be a part of this patients care team.  Lovenia Kim Pharm.D., BCPS Clinical Pharmacist 04/13/2013 6:23 PM Pager: [phone] Phone: [phone]

## 2013-04-01 NOTE — Progress Notes (Signed)
May access Right TLC for medication infusions per Dr. Delton Coombes.

## 2013-04-01 NOTE — Progress Notes (Signed)
Red seal on the foley was noticed to broken. Foley bag was changed into bag with urometer since the seal was already broken.

## 2013-04-01 NOTE — H&P (Addendum)
PULMONARY  / CRITICAL CARE MEDICINE  Name: Lee White MRN: [id number] DOB: 12/26/1956    ADMISSION DATE:  03/28/2013  REFERRING MD :  [facility]  PRIMARY SERVICE: PCCM  CHIEF COMPLAINT:  Cardiac Arrest  BRIEF PATIENT DESCRIPTION: 57 y/o, morbidly obese male admitted 9/8 from [facility] post cardiac arrest (>40 min).    SIGNIFICANT EVENTS / STUDIES:  9/08 - arrest at work, initial rhythm shock with AED, 40 mins CPR.  Intubated by EMS with #7  LINES / TUBES: OETT #7 9/8>>>  CULTURES: Sputum 9/8>>> BCx2 9/8>>> UA 9/8>>> UDS 9/8>>>  ANTIBIOTICS: Unasyn 9/8>>>  HISTORY OF PRESENT ILLNESS:  57 y/o, morbidly obese male, with PMH of CAD s/p CABG / stents, HTN, HLD, PAF post op admitted 9/8 from [facility] post cardiac arrest.  Reportedly patient was at work and collapsed with bystander CPR.  AED indicated shock x1.  Pt received 40 minutes of CPR with 6 rounds of ACLS.  EMS arrival noted asystole, continued CPR with change to VFib - shocked with return to ST.  Intubated in field with #7 ETT.  Transferred to [facility] from [facility].      PAST MEDICAL HISTORY :  Past Medical History  Diagnosis Date  . S/P CABG (coronary artery bypass graft)   . HTN (hypertension)   . HLD (hyperlipidemia)    No past surgical history on file. Prior to Admission medications   Not on File   No Known Allergies  FAMILY HISTORY:  Family History  Problem Relation Age of Onset  . Cancer Father   . Stroke Mother     SOCIAL HISTORY:  reports that he has never smoked. He does not have any smokeless tobacco history on file. He reports that he does not drink alcohol or use illicit drugs.  REVIEW OF SYSTEMS:  Unable / PT intbated sedated.    SUBJECTIVE:   VITAL SIGNS: Temp:  [100.6 F (38.1 C)] 100.6 F (38.1 C) (09/08 1645) Pulse Rate:  [117-125] 117 (09/08 1700) Resp:  [21-32] 24 (09/08 1700) BP: (81-109)/(40-77) 81/60 mmHg (09/08 1700) SpO2:  [96 %-100 %] 99 %  (09/08 1700) FiO2 (%):  [100 %] 100 % (09/08 1615) Weight:  [320 lb (145.151 kg)] 320 lb (145.151 kg) (09/08 1500)  HEMODYNAMICS:    VENTILATOR SETTINGS: Vent Mode:  [-] PRVC FiO2 (%):  [100 %] 100 % Set Rate:  [24 bmp] 24 bmp Vt Set:  [600 mL] 600 mL PEEP:  [5 cmH20] 5 cmH20 Plateau Pressure:  [29 cmH20] 29 cmH20  INTAKE / OUTPUT: Intake/Output     09/07 0701 - 09/08 0700 09/08 0701 - 09/09 0700   I.V. (mL/kg)  195 (1.3)   Total Intake(mL/kg)  195 (1.3)   Urine (mL/kg/hr)  30   Total Output   30   Net   +165          PHYSICAL EXAMINATION: General:  Morbidly obese male on vent Neuro:  Obtunded / sedate HEENT:  Mm pink/moist, short thick neck, OETT Cardiovascular:  s1s2 rrr, distant tones Lungs:  resp's even/non-labored on vent, lungs bilaterally coarse rhonchi Abdomen:  Obese /soft, bsx4 hypoactive Musculoskeletal:  No acute deformities Skin:  Warm/dry, abrasions on knees  LABS:  CBC No results found for this basename: WBC, HGB, HCT, PLT,  in the last 72 hours Coag's No results found for this basename: APTT, INR,  in the last 72 hours BMET No results found for this basename: NA, K, CL, CO2, BUN,  CREATININE, GLUCOSE,  in the last 72 hours Electrolytes No results found for this basename: CALCIUM, MG, PHOS,  in the last 72 hours Sepsis Markers No results found for this basename: LACTICACIDVEN, PROCALCITON, O2SATVEN,  in the last 72 hours ABG Recent Labs     04/07/2013  1657  PHART  7.302*  PCO2ART  50.2*  PO2ART  217.0*   Liver Enzymes No results found for this basename: AST, ALT, ALKPHOS, BILITOT, ALBUMIN,  in the last 72 hours Cardiac Enzymes No results found for this basename: TROPONINI, PROBNP,  in the last 72 hours Glucose No results found for this basename: GLUCAP,  in the last 72 hours  Imaging No results found.   CXR: Post TLC pending>>>  ASSESSMENT / PLAN:  PULMONARY A: Acute Respiratory Failure - in setting of cardiac arrest Pulmonary  edema Possible Aspiration  P:   -full vent support, titrate fiO2 to support sats >92% -f/u ABG with hypothermia protocol  -now cxr post line placement for eval of ETT / central line -f/u cxr in am -PRN albuterol  -see ID  CARDIOVASCULAR A:  Cardiac Arrest - initial rhythm shocked via AED.  First EMS witnessed rhythm asystole.  >40 minutes CPR Cardiogenic Shock - post arrest  P:  -appreciate cardiology input -pending Cath lab 9/8 evening for angiography -defer cardiac management to Dr. Tenny Craw -hypothermia protocol -pressors for MAP >65  RENAL A:   Acute renal insufficiency Oliguria Mixed Acidosis P:   -D5w with Bicarb at 75 -assess CMP  GASTROINTESTINAL A:   Likely shock liver  P:   -assess LFT's -PPI  -stat chem   HEMATOLOGIC A:   P:  -Assess CBC -Heparin for DVT proph / SCD's  INFECTIOUS A:   Purulent sputum - suspect aspiration  Fever - 100.6 on arrival to [facility] P:   -empiric unasyn  -assess BCx2, UA, resp cultures -UDS  ENDOCRINE A:   Hyperglycemia with no prior hx of DM P:   -SSI  -assess A1c  NEUROLOGIC A: Post anoxic acute encephalopathy P:   -cooling protocol: nimbex, versed, fentanyl -monitor neuro status  Family updated in detail. They understand that prognosis is guarded.   I have personally obtained a history, examined the patient, evaluated laboratory and imaging results, formulated the assessment and plan and placed orders.  CRITICAL CARE: The patient is critically ill with multiple organ systems failure and requires high complexity decision making for assessment and support, frequent evaluation and titration of therapies, application of advanced monitoring technologies and extensive interpretation of multiple databases. Critical Care Time devoted to patient care services described in this note is 35 minutes.   Billy Fischer, MD ; [facility] [phone].  After 5:30 PM or weekends, call [phone]  04/23/2013, 5:13 PM

## 2013-04-02 ENCOUNTER — Inpatient Hospital Stay (HOSPITAL_COMMUNITY): Payer: BC Managed Care – PPO

## 2013-04-02 DIAGNOSIS — E872 Acidosis, unspecified: Secondary | ICD-10-CM | POA: Diagnosis present

## 2013-04-02 DIAGNOSIS — K7200 Acute and subacute hepatic failure without coma: Secondary | ICD-10-CM

## 2013-04-02 DIAGNOSIS — I2589 Other forms of chronic ischemic heart disease: Secondary | ICD-10-CM

## 2013-04-02 DIAGNOSIS — R570 Cardiogenic shock: Secondary | ICD-10-CM

## 2013-04-02 DIAGNOSIS — I25810 Atherosclerosis of coronary artery bypass graft(s) without angina pectoris: Secondary | ICD-10-CM

## 2013-04-02 DIAGNOSIS — I255 Ischemic cardiomyopathy: Secondary | ICD-10-CM | POA: Diagnosis present

## 2013-04-02 DIAGNOSIS — Z5189 Encounter for other specified aftercare: Secondary | ICD-10-CM

## 2013-04-02 DIAGNOSIS — R739 Hyperglycemia, unspecified: Secondary | ICD-10-CM | POA: Diagnosis present

## 2013-04-02 DIAGNOSIS — R7309 Other abnormal glucose: Secondary | ICD-10-CM

## 2013-04-02 DIAGNOSIS — T17800A Unspecified foreign body in other parts of respiratory tract causing asphyxiation, initial encounter: Secondary | ICD-10-CM

## 2013-04-02 LAB — POCT I-STAT, CHEM 8
BUN: 16 mg/dL (ref 6–23)
BUN: 14 mg/dL (ref 6–23)
BUN: 15 mg/dL (ref 6–23)
BUN: 12 mg/dL (ref 6–23)
Chloride: 102 mEq/L (ref 96–112)
Chloride: 102 mEq/L (ref 96–112)
Chloride: 103 mEq/L (ref 96–112)
Creatinine, Ser: 1.1 mg/dL (ref 0.50–1.35)
Creatinine, Ser: 1.1 mg/dL (ref 0.50–1.35)
Creatinine, Ser: 1.1 mg/dL (ref 0.50–1.35)
Creatinine, Ser: 1.4 mg/dL — ABNORMAL HIGH (ref 0.50–1.35)
Glucose, Bld: 255 mg/dL — ABNORMAL HIGH (ref 70–99)
Glucose, Bld: 217 mg/dL — ABNORMAL HIGH (ref 70–99)
HCT: 51 % (ref 39.0–52.0)
HCT: 49 % (ref 39.0–52.0)
Hemoglobin: 17.3 g/dL — ABNORMAL HIGH (ref 13.0–17.0)
Hemoglobin: 17.7 g/dL — ABNORMAL HIGH (ref 13.0–17.0)
Potassium: 3.4 mEq/L — ABNORMAL LOW (ref 3.5–5.1)
Potassium: 3.6 mEq/L (ref 3.5–5.1)
Potassium: 2.9 mEq/L — ABNORMAL LOW (ref 3.5–5.1)
Potassium: 2.8 mEq/L — ABNORMAL LOW (ref 3.5–5.1)
Sodium: 139 mEq/L (ref 135–145)
Sodium: 139 mEq/L (ref 135–145)
Sodium: 140 mEq/L (ref 135–145)
Sodium: 140 mEq/L (ref 135–145)
Sodium: 140 mEq/L (ref 135–145)
TCO2: 16 mmol/L (ref 0–100)
TCO2: 20 mmol/L (ref 0–100)

## 2013-04-02 LAB — COMPREHENSIVE METABOLIC PANEL
ALT: 73 U/L — ABNORMAL HIGH (ref 0–53)
AST: 108 U/L — ABNORMAL HIGH (ref 0–37)
Albumin: 3.1 g/dL — ABNORMAL LOW (ref 3.5–5.2)
CO2: 15 mEq/L — ABNORMAL LOW (ref 19–32)
Calcium: 8 mg/dL — ABNORMAL LOW (ref 8.4–10.5)
Creatinine, Ser: 1.4 mg/dL — ABNORMAL HIGH (ref 0.50–1.35)
GFR calc non Af Amer: 55 mL/min — ABNORMAL LOW (ref >90)
Sodium: 137 mEq/L (ref 135–145)
Total Protein: 6.2 g/dL (ref 6.0–8.3)

## 2013-04-02 LAB — CBC
HCT: 47.1 % (ref 39.0–52.0)
MCHC: 34.2 g/dL (ref 30.0–36.0)
Platelets: 238 10*3/uL (ref 150–400)
RDW: 14 % (ref 11.5–15.5)
WBC: 22.1 10*3/uL — ABNORMAL HIGH (ref 4.0–10.5)

## 2013-04-02 LAB — GLUCOSE, CAPILLARY
Glucose-Capillary: 174 mg/dL — ABNORMAL HIGH (ref 70–99)
Glucose-Capillary: 198 mg/dL — ABNORMAL HIGH (ref 70–99)
Glucose-Capillary: 251 mg/dL — ABNORMAL HIGH (ref 70–99)
Glucose-Capillary: 220 mg/dL — ABNORMAL HIGH (ref 70–99)
Glucose-Capillary: 209 mg/dL — ABNORMAL HIGH (ref 70–99)

## 2013-04-02 LAB — BLOOD GAS, ARTERIAL
Bicarbonate: 15.4 mEq/L — ABNORMAL LOW (ref 20.0–24.0)
MECHVT: 600 mL
O2 Saturation: 98.1 %
PEEP: 5 cmH2O
Patient temperature: 93.2
RATE: 24 resp/min
pH, Arterial: 7.302 — ABNORMAL LOW (ref 7.350–7.450)

## 2013-04-02 LAB — TROPONIN I
Troponin I: 2.48 ng/mL (ref <0.30)
Troponin I: 3.01 ng/mL (ref <0.30)

## 2013-04-02 LAB — APTT: aPTT: 91 seconds — ABNORMAL HIGH (ref 24–37)

## 2013-04-02 LAB — HEPARIN LEVEL (UNFRACTIONATED): Heparin Unfractionated: 0.39 IU/mL (ref 0.30–0.70)

## 2013-04-02 LAB — HEMOGLOBIN A1C: Mean Plasma Glucose: 137 mg/dL — ABNORMAL HIGH (ref <117)

## 2013-04-02 MED ORDER — SODIUM CHLORIDE 0.9 % IV SOLN
INTRAVENOUS | Status: DC
  Administered 2013-04-02: 6.8 [IU]/h via INTRAVENOUS
  Administered 2013-04-02: 1.6 [IU]/h via INTRAVENOUS
  Administered 2013-04-02: 9.4 [IU]/h via INTRAVENOUS
  Administered 2013-04-02: 10 [IU]/h via INTRAVENOUS
  Filled 2013-04-02 (×2): qty 1

## 2013-04-02 MED ORDER — POTASSIUM CHLORIDE 10 MEQ/50ML IV SOLN
INTRAVENOUS | Status: AC
  Filled 2013-04-02: qty 50

## 2013-04-02 MED ORDER — POTASSIUM CHLORIDE 10 MEQ/50ML IV SOLN
10.0000 meq | INTRAVENOUS | Status: AC
  Administered 2013-04-02 (×4): 10 meq via INTRAVENOUS
  Filled 2013-04-02: qty 200

## 2013-04-02 MED ORDER — ALBUTEROL SULFATE (5 MG/ML) 0.5% IN NEBU
2.5000 mg | INHALATION_SOLUTION | Freq: Four times a day (QID) | RESPIRATORY_TRACT | Status: DC
  Administered 2013-04-02 – 2013-04-05 (×12): 2.5 mg via RESPIRATORY_TRACT
  Filled 2013-04-02 (×12): qty 0.5

## 2013-04-02 MED ORDER — SODIUM CHLORIDE 0.9 % IV SOLN
INTRAVENOUS | Status: DC
  Administered 2013-04-02: 16:00:00 via INTRAVENOUS

## 2013-04-02 MED ORDER — SODIUM CHLORIDE 0.9 % IV SOLN
INTRAVENOUS | Status: DC
  Administered 2013-04-02 (×2): via INTRAVENOUS

## 2013-04-02 MED ORDER — INSULIN ASPART 100 UNIT/ML ~~LOC~~ SOLN
2.0000 [IU] | SUBCUTANEOUS | Status: DC
  Administered 2013-04-03 – 2013-04-05 (×4): 2 [IU] via SUBCUTANEOUS

## 2013-04-02 MED ORDER — POTASSIUM CHLORIDE 10 MEQ/50ML IV SOLN
10.0000 meq | INTRAVENOUS | Status: AC
  Administered 2013-04-02 (×6): 10 meq via INTRAVENOUS
  Filled 2013-04-02 (×6): qty 50

## 2013-04-02 MED ORDER — POTASSIUM CHLORIDE 10 MEQ/50ML IV SOLN
10.0000 meq | INTRAVENOUS | Status: AC
  Administered 2013-04-02 (×3): 10 meq via INTRAVENOUS
  Filled 2013-04-02: qty 50

## 2013-04-02 NOTE — Significant Event (Signed)
Lab Results  Component Value Date   CREATININE 1.10 04/02/2013   BUN 15 04/02/2013   NA 140 04/02/2013   K 2.8* 04/02/2013   CL 103 04/02/2013   CO2 15* 04/02/2013    Will give KCL x 6 runs IV through CVL.  Coralyn Helling, MD 04/02/2013, 4:42 PM

## 2013-04-02 NOTE — Progress Notes (Signed)
INITIAL NUTRITION ASSESSMENT  DOCUMENTATION CODES Per approved criteria  -Morbid Obesity   INTERVENTION: 1.  Enteral nutrition; initiate Vital HP @ 20 mL/hr continuous.  Advance by 10 mL q 4 hrs to 50 goal with Prostat 60 mL TID to provide 1800 kcal, 195g protein, 1003 mL free water.  NUTRITION DIAGNOSIS: Inadequate oral intake related to inability to eat as evidenced by vent, NPO.   Monitor:  1.  Enteral nutrition; initiation with tolerance.  Enteral nutrition to provide 60-70% of estimated calorie needs (22-25 kcals/kg ideal body weight) and 100% of estimated protein needs, based on ASPEN guidelines for permissive underfeeding in critically ill obese individuals. 2.  Wt/wt change; monitor trends  Reason for Assessment: vent  57 y.o. male  Admitting Dx: Cardiac arrest  ASSESSMENT: Pt admitted with cardiac arrest.  Currently intubated and on [place] Sun protocol.  Patient is currently intubated on ventilator support.  MV: 13.5 L/min Temp:Temp (24hrs), Avg:94.3 F (34.6 C), Min:90.1 F (32.3 C), Max:100.6 F (38.1 C)  Propofol: none  Wife reports pt with good appetite and intake per his usual PTA.  Pt typically eats 2 meals per day- breakfast and evening meal.  Wife states his intake and wt have been stable.  Nutrition Focused Physical Exam:  Subcutaneous Fat:  Orbital Region: WNL Upper Arm Region: WNL Thoracic and Lumbar Region: WNL  Muscle:  Temple Region: WNL Clavicle Bone Region: WNL Clavicle and Acromion Bone Region: WNL Scapular Bone Region: WNL Dorsal Hand: WNL Patellar Region: WNL Anterior Thigh Region: WNL Posterior Calf Region: WNL  Edema: none present  Height: Ht Readings from Last 1 Encounters:  2013-04-02 5\' 10"  (1.778 m)    Weight: Wt Readings from Last 1 Encounters:  Apr 02, 2013 320 lb (145.151 kg)    Ideal Body Weight: 75.4kg  % Ideal Body Weight: 192%  Wt Readings from Last 10 Encounters:  04/02/13 320 lb (145.151 kg)  04/02/2013 320 lb  (145.151 kg)    Usual Body Weight: 320 lbs, +/- 10 lbs seasonally per wife, stable  % Usual Body Weight: 100%  BMI:  Body mass index is 45.92 kg/(m^2).  Estimated Nutritional Needs: Kcal: 2514 Permissive underfeeding:  1610-9604 Protein: 188g Fluid: >2.2 L/day  Skin: intact  Diet Order:  NPO  EDUCATION NEEDS: -Education needs addressed with wife   Intake/Output Summary (Last 24 hours) at 04/02/13 0927 Last data filed at 04/02/13 0900  Gross per 24 hour  Intake 4257.2 ml  Output   1467 ml  Net 2790.2 ml    Last BM: PTA  Labs:   Recent Labs Lab 2013-04-02 2000 04/02/2013 2208  04/02/13 0201 04/02/13 0405 04/02/13 0817  NA 133* 134*  < > 138 137 139  K 5.1 4.2  < > 3.4* 3.4* 3.6  CL 96 97  < > 105 99 102  CO2 21 17*  --   --  15*  --   BUN 17 18  < > 16 19 16   CREATININE 1.76* 1.64*  < > 1.30 1.40* 1.30  CALCIUM 8.0* 7.8*  --   --  8.0*  --   GLUCOSE 223* 221*  < > 255* 256* 267*  < > = values in this interval not displayed.  CBG (last 3)   Recent Labs  04/02/13 0424 04/02/13 0658 04/02/13 0814  GLUCAP 208* 208* 251*    Scheduled Meds: . albuterol  2.5 mg Nebulization Q6H  . ampicillin-sulbactam (UNASYN) IV  1.5 g Intravenous Q6H  . antiseptic oral rinse  15  mL Mouth Rinse QID  . artificial tears  1 application Both Eyes Q8H  . aspirin  81 mg Oral Daily  . chlorhexidine  15 mL Mouth Rinse BID  . clopidogrel  75 mg Oral Q breakfast  . insulin aspart  2-6 Units Subcutaneous Q4H  . pantoprazole (PROTONIX) IV  40 mg Intravenous Q24H  . sodium chloride  3 mL Intravenous Q12H    Continuous Infusions: . sodium chloride 120 mL/hr at 04/29/13 1656  . cisatracurium (NIMBEX) infusion 1 mcg/kg/min (April 29, 2013 1654)  . fentaNYL infusion INTRAVENOUS 200 mcg/hr (04/02/13 1610)  . heparin 1,000 Units/hr (April 29, 2013 2100)  . insulin (NOVOLIN-R) infusion    . midazolam (VERSED) infusion 4 mg/hr (04/02/13 0121)  . norepinephrine (LEVOPHED) Adult infusion 20  mcg/min (04/02/13 0000)  .  sodium bicarbonate  infusion 1000 mL 75 mL/hr at 04/02/13 0448    Past Medical History  Diagnosis Date  . S/P CABG (coronary artery bypass graft)   . HTN (hypertension)   . HLD (hyperlipidemia)     History reviewed. No pertinent past surgical history.  Loyce Dys, MS RD LDN Clinical Inpatient Dietitian Pager: [phone] Weekend/After hours pager: [phone]

## 2013-04-02 NOTE — Progress Notes (Signed)
ANTICOAGULATION CONSULT NOTE - Follow Up Consult  Pharmacy Consult for Heparin Indication: MI  Allergies  Allergen Reactions  . Other Hives and Swelling    "Unknown medication from 30 years ago.  He has had various IV and Pain medications from previous performed procedures with no problems.  Given benadryl to counteract reaction-per patient's family."    Patient Measurements: Height: 5\' 10"  (177.8 cm) Weight: 320 lb (145.151 kg) IBW/kg (Calculated) : 73 Heparin Dosing Weight: 107 kg  Vital Signs: Temp: 92.5 F (33.6 C) (09/09 1200) Temp src: Core (Comment) (09/09 1200) BP: 126/63 mmHg (09/09 0858) Pulse Rate: 69 (09/09 1100)  Labs:  Recent Labs  04/19/2013 1735  04/19/2013 2000  04/08/2013 2245  04/02/13 0201 04/02/13 0405 04/02/13 0406 04/02/13 0817 04/02/13 1212 04/02/13 1230  HGB 17.1*  < >  --   --   --   < > 17.3* 16.1  --  16.7 16.7  --   HCT 49.6  < >  --   --   --   < > 51.0 47.1  --  49.0 49.0  --   PLT 235  --   --   --   --   --   --  238  --   --   --   --   APTT 30  --  29  --   --   --   --   --  91*  --   --   --   LABPROT 15.6*  --  15.8*  --   --   --   --   --  15.9*  --   --   --   INR 1.27  --  1.29  --   --   --   --   --  1.30  --   --   --   HEPARINUNFRC  --   --   --   --   --   --   --  0.39  --   --   --  0.57  CREATININE 1.84*  < > 1.76*  < >  --   < > 1.30 1.40*  --  1.30 1.20  --   CKTOTAL 956*  --   --   --   --   --   --   --   --   --   --   --   CKMB 22.4*  --   --   --   --   --   --   --   --   --   --   --   TROPONINI 1.58*  --   --   --  2.48*  --   --  3.01*  --   --   --   --   < > = values in this interval not displayed.  Estimated Creatinine Clearance: 99.1 ml/min (by C-G formula based on Cr of 1.2).   Assessment: 56 yo M with history CABG admitted 04/04/2013 on transfer from [facility] post cardiac arrest (>40 min) with bystander CPR and AED shock. PMH: HTN, CAD, Morbid obesity, HLD, CABG '03,  Events: s/p cath  initiating hypothermia (Start rewarming 9/10 at 0030)  Anticoag: MI on hypothermia protocol. Heparin level 0.39, 0.57 on recheck.  Goal of Therapy:  Heparin level 0.3-0.7 units/ml Monitor platelets by anticoagulation protocol: Yes  Plan:  Heparin 1000 units/hr to continue Rewarming to start late tonight  Jamielyn Petrucci S. Merilynn Finland,  PharmD, BCPS Clinical Staff Pharmacist Pager [phone]  Misty Stanley Stillinger 04/02/2013,1:22 PM

## 2013-04-02 NOTE — Progress Notes (Signed)
UR Completed.  Ashland Wiseman Jane [phone] 04/02/2013  

## 2013-04-02 NOTE — Progress Notes (Signed)
eLink Physician-Brief Progress Note Patient Name: Lee White DOB: 02/10/1956 MRN: [id number]  Date of Service  04/02/2013   HPI/Events of Note  Hypokalemia  eICU Interventions  Potassium replaced   Intervention Category Minor Interventions: Electrolytes abnormality - evaluation and management  DETERDING,ELIZABETH 04/02/2013, 2:15 AM

## 2013-04-02 NOTE — Progress Notes (Signed)
PROGRESS NOTE  Subjective:   Lee White is a 57 yo with hx of CAD, CABG admitted yesterday following a cardiac arrest. Cath yesterday revealed subtotalled LM, small non dominant , occluded SVG to RCA, tight 90% stenosis in the SVG to OM, patent LIMA to distal LAD. His LV function is markedly depressed with EF of 20-25% ( akinetic apex, inferior wall)   Objective:    Vital Signs:   Temp:  [90.9 F (32.7 C)-100.6 F (38.1 C)] 90.9 F (32.7 C) (09/09 0700) Pulse Rate:  [56-125] 62 (09/09 0700) Resp:  [0-32] 4 (09/09 0700) BP: (81-145)/(40-89) 145/80 mmHg (09/09 0349) SpO2:  [96 %-100 %] 100 % (09/09 0700) Arterial Line BP: (100-147)/(62-95) 113/62 mmHg (09/09 0700) FiO2 (%):  [80 %-100 %] 80 % (09/09 0349) Weight:  [320 lb (145.151 kg)] 320 lb (145.151 kg) (09/08 1500)      24-hour weight change: Weight change:   Weight trends: Filed Weights   03/25/2013 1500  Weight: 320 lb (145.151 kg)    Intake/Output:  09/08 0701 - 09/09 0700 In: 3984.2 [I.V.:3684.2; IV Piggyback:300] Out: 1330 [Urine:1030; Emesis/NG output:300]     Physical Exam: BP 145/80  Pulse 62  Temp(Src) 90.9 F (32.7 C) (Core (Comment))  Resp 4  Ht 5\' 10"  (1.778 m)  Wt 320 lb (145.151 kg)  BMI 45.92 kg/m2  SpO2 100%  General: Vital signs reviewed and noted. Patient sedated, on vent, on [place]   Head: intubated  Eyes: closed  Throat: intubated  Neck:  thick  Lungs:    on vent  Heart:  RR,   Abdomen:  Soft, non-tender, non-distended    Extremities: Trace edema, cool.   Neurologic: Sedated , on vent  Psych: Na     Labs: BMET:  Recent Labs  03/31/2013 2208  04/02/13 0201 04/02/13 0405  NA 134*  < > 138 137  K 4.2  < > 3.4* 3.4*  CL 97  < > 105 99  CO2 17*  --   --  15*  GLUCOSE 221*  < > 255* 256*  BUN 18  < > 16 19  CREATININE 1.64*  < > 1.30 1.40*  CALCIUM 7.8*  --   --  8.0*  < > = values in this interval not displayed.  Liver function tests:  Recent Labs  03/30/2013 1735  04/02/13 0405  AST 109* 108*  ALT 81* 73*  ALKPHOS 77 51  BILITOT 0.6 0.6  PROT 7.0 6.2  ALBUMIN 3.5 3.1*   No results found for this basename: LIPASE, AMYLASE,  in the last 72 hours  CBC:  Recent Labs  03/29/2013 1735  04/02/13 0201 04/02/13 0405  WBC 26.1*  --   --  22.1*  HGB 17.1*  < > 17.3* 16.1  HCT 49.6  < > 51.0 47.1  MCV 93.9  --   --  92.5  PLT 235  --   --  238  < > = values in this interval not displayed.  Cardiac Enzymes:  Recent Labs  04/07/2013 1735 03/26/2013 2245 04/02/13 0405  CKTOTAL 956*  --   --   CKMB 22.4*  --   --   TROPONINI 1.58* 2.48* 3.01*    Coagulation Studies:  Recent Labs  04/20/2013 1735 04/17/2013 2000 04/02/13 0406  LABPROT 15.6* 15.8* 15.9*  INR 1.27 1.29 1.30    Other: No components found with this basename: POCBNP,  No results found for this basename: DDIMER,  in the last 72 hours  Recent Labs  04/19/2013 1735  HGBA1C 6.4*    Recent Labs  04/15/2013 1735  CHOL 170  HDL 37*  LDLCALC 101*  TRIG 159*  CHOLHDL 4.6   No results found for this basename: TSH, T4TOTAL, FREET3, T3FREE, THYROIDAB,  in the last 72 hours No results found for this basename: VITAMINB12, FOLATE, FERRITIN, TIBC, IRON, RETICCTPCT,  in the last 72 hours   Other results:  Tele :  NSR / sinus brady at 57  Medications:    Infusions: . sodium chloride 120 mL/hr at 04/04/2013 1656  . cisatracurium (NIMBEX) infusion 1 mcg/kg/min (03/25/2013 1654)  . fentaNYL infusion INTRAVENOUS 200 mcg/hr (04/02/13 1914)  . heparin 1,000 Units/hr (03/27/2013 2100)  . midazolam (VERSED) infusion 4 mg/hr (04/02/13 0121)  . norepinephrine (LEVOPHED) Adult infusion 20 mcg/min (04/02/13 0000)  .  sodium bicarbonate  infusion 1000 mL 75 mL/hr at 04/02/13 0448    Scheduled Medications: . ampicillin-sulbactam (UNASYN) IV  1.5 g Intravenous Q6H  . antiseptic oral rinse  15 mL Mouth Rinse QID  . artificial tears  1 application Both Eyes Q8H  . aspirin  81 mg Oral Daily  .  chlorhexidine  15 mL Mouth Rinse BID  . clopidogrel  75 mg Oral Q breakfast  . insulin aspart  0-9 Units Subcutaneous Q4H  . pantoprazole (PROTONIX) IV  40 mg Intravenous Q24H  . sodium chloride  3 mL Intravenous Q12H    Assessment/ Plan:      Cardiac arrest:  On vent, [employer]. Cath shows occluded graft to RCA and a tight stenosis of the graft to OM.  Will   assess his mental status once he warms up.  He had CPR for ~45 minutes.  If he makes a meaningful recovery, we can attempt PCI of the graft to OM.  I do not think attempting to open the RCA graft will be successful.  Continue pressors for now.  Erythrocytosis:  I suspect that he has sleep apnea    Acute respiratory failure with hypoxia- plans per PCCM    Acute renal failure   Oliguria   Encephalopathy acute   Disposition:   Length of Stay: 1  Vesta Mixer, Montez Hageman., MD, [facility] 04/02/2013, 7:37 AM Office [phone] Pager [phone]

## 2013-04-02 NOTE — Progress Notes (Signed)
ANTICOAGULATION CONSULT NOTE   Pharmacy Consult for Heparin Indication: chest pain/ACS  Allergies  Allergen Reactions  . Other Hives and Swelling    "Unknown medication from 30 years ago.  He has had various IV and Pain medications from previous performed procedures with no problems.  Given benadryl to counteract reaction-per patient's family."    Patient Measurements: Height: 5\' 10"  (177.8 cm) Weight: 320 lb (145.151 kg) IBW/kg (Calculated) : 73 Heparin Dosing Weight: 107  Vital Signs: Temp: 92.5 F (33.6 C) (09/09 0400) Temp src: Core (Comment) (09/09 0400) BP: 145/80 mmHg (09/09 0349) Pulse Rate: 68 (09/09 0400)  Labs:  Recent Labs  04/04/2013 1735  04/19/2013 2000  04/04/2013 2245 04/02/13 0039 04/02/13 0201 04/02/13 0405  HGB 17.1*  < >  --   --   --  17.7* 17.3* 16.1  HCT 49.6  < >  --   --   --  52.0 51.0 47.1  PLT 235  --   --   --   --   --   --  238  APTT 30  --  29  --   --   --   --   --   LABPROT 15.6*  --  15.8*  --   --   --   --   --   INR 1.27  --  1.29  --   --   --   --   --   HEPARINUNFRC  --   --   --   --   --   --   --  0.39  CREATININE 1.84*  < > 1.76*  < >  --  1.40* 1.30 1.40*  CKTOTAL 956*  --   --   --   --   --   --   --   CKMB 22.4*  --   --   --   --   --   --   --   TROPONINI 1.58*  --   --   --  2.48*  --   --   --   < > = values in this interval not displayed.  Estimated Creatinine Clearance: 84.9 ml/min (by C-G formula based on Cr of 1.4).  Assessment: 57 yo male s/p cardiac arrest, on hypothermia protocol,  for heparin Goal of Therapy:  Heparin level 0.3-0.7 units/ml Monitor platelets by anticoagulation protocol: Yes   Plan:  Continue Heparin at current rate  Geannie Risen, PharmD, BCPS

## 2013-04-02 NOTE — Progress Notes (Addendum)
PULMONARY  / CRITICAL CARE MEDICINE  Name: Lee White MRN: [id number] DOB: 1956-04-15    ADMISSION DATE:  04/22/2013  REFERRING MD :  [facility]  PRIMARY SERVICE: PCCM  CHIEF COMPLAINT:  Cardiac Arrest  BRIEF PATIENT DESCRIPTION: 57 y/o, morbidly obese male admitted 9/8 from [facility] post cardiac arrest (>40 min).    SIGNIFICANT EVENTS / STUDIES:  9/08 - arrest at work, initial rhythm shock with AED, 40 mins CPR.  Intubated by EMS with #7 9/8 CATH : severe 3VD. Severe stenosis SVG OM1-OM2, total RCA, patent LIMA to LAD, EF 20%.   LINES / TUBES: OETT #7 9/8>>> R [place] CVL 9/8>>  CULTURES: Sputum 9/8>>> BCx1  9/8>>> UA 9/8>>> UDS 9/8>>>  ANTIBIOTICS: Unasyn (asp) 9/8>>>  SUBJECTIVE:  Paralyzed and sedated on vent. rewarm to start at 1230AM 9/10  VITAL SIGNS: Temp:  [90.1 F (32.3 C)-100.6 F (38.1 C)] 90.1 F (32.3 C) (09/09 0800) Pulse Rate:  [56-125] 61 (09/09 0800) Resp:  [0-32] 24 (09/09 0800) BP: (81-145)/(40-89) 110/62 mmHg (09/09 0750) SpO2:  [96 %-100 %] 100 % (09/09 0800) Arterial Line BP: (100-147)/(61-95) 124/61 mmHg (09/09 0800) FiO2 (%):  [80 %-100 %] 80 % (09/09 0800) Weight:  [145.151 kg (320 lb)] 145.151 kg (320 lb) (09/08 1500)  HEMODYNAMICS: CVP:  [13 mmHg-20 mmHg] 14 mmHg On Norepi  VENTILATOR SETTINGS: Vent Mode:  [-] PRVC FiO2 (%):  [80 %-100 %] 80 % Set Rate:  [24 bmp] 24 bmp Vt Set:  [600 mL] 600 mL PEEP:  [5 cmH20] 5 cmH20 Plateau Pressure:  [24 cmH20-29 cmH20] 26 cmH20  INTAKE / OUTPUT: Intake/Output     09/08 0701 - 09/09 0700 09/09 0701 - 09/10 0700   I.V. (mL/kg) 3684.2 (25.4)    IV Piggyback 300    Total Intake(mL/kg) 3984.2 (27.4)    Urine (mL/kg/hr) 1030    Emesis/NG output 300    Total Output 1330     Net +2654.2            PHYSICAL EXAMINATION: General:  Morbidly obese male on vent Neuro:  Sedated and paralyxed HEENT:  Mm pink/moist, short thick neck, OETT Cardiovascular:  s1s2 rrr, distant  tones Lungs:  Scattered rhonchi Abdomen:  Obese /soft, bsx4 hypoactive Musculoskeletal:  No acute deformities Skin:  Warm/dry, abrasions on knees  LABS:  CBC Recent Labs     04/19/2013  1735   04/02/13  0201  04/02/13  0405  04/02/13  0817  WBC  26.1*   --    --   22.1*   --   HGB  17.1*   < >  17.3*  16.1  16.7  HCT  49.6   < >  51.0  47.1  49.0  PLT  235   --    --   238   --    < > = values in this interval not displayed.   Coag's Recent Labs     03/28/2013  1735  04/23/2013  2000  04/02/13  0406  APTT  30  29  91*  INR  1.27  1.29  1.30   BMET Recent Labs     04/11/2013  2000  04/07/2013  2208   04/02/13  0201  04/02/13  0405  04/02/13  0817  NA  133*  134*   < >  138  137  139  K  5.1  4.2   < >  3.4*  3.4*  3.6  CL  96  97   < >  105  99  102  CO2  21  17*   --    --   15*   --   BUN  17  18   < >  16  19  16   CREATININE  1.76*  1.64*   < >  1.30  1.40*  1.30  GLUCOSE  223*  221*   < >  255*  256*  267*   < > = values in this interval not displayed.   Electrolytes Recent Labs     03/29/2013  2000  04/12/2013  2208  04/02/13  0405  CALCIUM  8.0*  7.8*  8.0*   Sepsis Markers No results found for this basename: LACTICACIDVEN, PROCALCITON, O2SATVEN,  in the last 72 hours ABG Recent Labs     04/08/2013  1657  04/02/13  0358  PHART  7.302*  7.302*  PCO2ART  50.2*  30.7*  PO2ART  217.0*  104.0*   Liver Enzymes Recent Labs     03/28/2013  1735  04/02/13  0405  AST  109*  108*  ALT  81*  73*  ALKPHOS  77  51  BILITOT  0.6  0.6  ALBUMIN  3.5  3.1*   Cardiac Enzymes Recent Labs     04/07/2013  1735  03/28/2013  2245  04/02/13  0405  TROPONINI  1.58*  2.48*  3.01*   Glucose Recent Labs     04/02/13  0025  04/02/13  0200  04/02/13  0310  04/02/13  0424  04/02/13  0658  04/02/13  0814  GLUCAP  193*  174*  198*  208*  208*  251*    Imaging   CXR: 9/9: pulm edema, stable support apparatus  ASSESSMENT / PLAN:  Principal Problem:   Cardiac  arrest Active Problems:   Acute respiratory failure with hypoxia   Pulmonary edema   Cardiogenic shock   Acute renal failure   Oliguria   Encephalopathy acute   Cardiomyopathy, ischemic   Hyperglycemia   Shock liver   Metabolic acidosis   Aspiration into lower respiratory tract   PULMONARY A: Acute Respiratory Failure - in setting of cardiac arrest Pulmonary edema Possible Aspiration  P:   -cont full vent support, titrate fiO2 to support sats >92%, reduce I TIME to .70 -8cc/kg= 580cc -f/u cxr in am -scheduled  albuterol    CARDIOVASCULAR A:  Cardiac Arrest - initial rhythm shocked via AED.  First EMS witnessed rhythm asystole.  >40 minutes CPR Cardiogenic Shock - post arrest  Hx of CAD and pCABG CATH 9/8: severe 3VD. Severe stenosis SVG OM1-OM2, total RCA, patent LIMA to LAD, EF 20%. P:  -appreciate cardiology input -cont hypothermia protocol, start rewarm at 1230AM 9/10 -pressors for MAP >65 -may need repeat CAB with SVG to OM if recovers   RENAL A:   Acute renal insufficiency Oliguria REsolved Mixed Acidosis persists P:   -cont D5w with Bicarb at 75  GASTROINTESTINAL A:    shock liver , elevated AST ALT P:   -reassess LFT's 9/10 -PPI  -NO TF FOR NOW  HEMATOLOGIC A:   P:  -Assess CBC -Heparin for DVT proph / SCD's  INFECTIOUS A:   Purulent sputum - suspect aspiration  Fever - 100.6 on arrival to [facility] UDS neg  P:   -cont empiric unasyn  -assess BCx1 (only able to get one set) , UA, resp cultures   ENDOCRINE A:  Hyperglycemia with no prior hx of DM P:   -ICU SSI protocol -need to start insulin drip am 9/8 -assess A1c  NEUROLOGIC A: Post anoxic acute encephalopathy P:   -cooling protocol: nimbex, versed, fentanyl -monitor neuro status  Family updated in detail. They understand that prognosis is guarded.  TODAYS SUMMARY: 57 y.o.M s/p asystolic arrest d/t CAD and severe 3VD and STEMI with EF 20% and progression of CAD/stenosis in  SVG from prior CABG.  Patient on artic sun protocol. Plan to start rewarm at 1230AM 9/10, cont pressors, ABX for aspiration, albuterol for BD, full vent supp with vent adjustments, cont BICARB drip.    I have personally obtained a history, examined the patient, evaluated laboratory and imaging results, formulated the assessment and plan and placed orders.  CRITICAL CARE: The patient is critically ill with multiple organ systems failure and requires high complexity decision making for assessment and support, frequent evaluation and titration of therapies, application of advanced monitoring technologies and extensive interpretation of multiple databases. Critical Care Time devoted to patient care services described in this note is 40 minutes.   Dorcas Carrow Beeper  [phone]  Cell  [phone]  If no response or cell goes to voicemail, call beeper [phone]   04/02/2013, 8:47 AM

## 2013-04-03 LAB — POCT I-STAT, CHEM 8
BUN: 10 mg/dL (ref 6–23)
BUN: 11 mg/dL (ref 6–23)
BUN: 12 mg/dL (ref 6–23)
BUN: 9 mg/dL (ref 6–23)
Calcium, Ion: 1.04 mmol/L — ABNORMAL LOW (ref 1.12–1.23)
Calcium, Ion: 1.06 mmol/L — ABNORMAL LOW (ref 1.12–1.23)
Chloride: 102 mEq/L (ref 96–112)
Chloride: 103 mEq/L (ref 96–112)
Chloride: 103 mEq/L (ref 96–112)
Chloride: 104 mEq/L (ref 96–112)
Creatinine, Ser: 1 mg/dL (ref 0.50–1.35)
Hemoglobin: 14.6 g/dL (ref 13.0–17.0)
Potassium: 2.9 mEq/L — ABNORMAL LOW (ref 3.5–5.1)
Potassium: 2.9 mEq/L — ABNORMAL LOW (ref 3.5–5.1)
Sodium: 140 mEq/L (ref 135–145)
Sodium: 141 mEq/L (ref 135–145)

## 2013-04-03 LAB — GLUCOSE, CAPILLARY
Glucose-Capillary: 145 mg/dL — ABNORMAL HIGH (ref 70–99)
Glucose-Capillary: 138 mg/dL — ABNORMAL HIGH (ref 70–99)
Glucose-Capillary: 135 mg/dL — ABNORMAL HIGH (ref 70–99)
Glucose-Capillary: 124 mg/dL — ABNORMAL HIGH (ref 70–99)
Glucose-Capillary: 126 mg/dL — ABNORMAL HIGH (ref 70–99)
Glucose-Capillary: 105 mg/dL — ABNORMAL HIGH (ref 70–99)

## 2013-04-03 LAB — HEPATIC FUNCTION PANEL
ALT: 65 U/L — ABNORMAL HIGH (ref 0–53)
AST: 118 U/L — ABNORMAL HIGH (ref 0–37)
Bilirubin, Direct: 0.2 mg/dL (ref 0.0–0.3)
Total Bilirubin: 0.7 mg/dL (ref 0.3–1.2)

## 2013-04-03 LAB — CBC
HCT: 41 % (ref 39.0–52.0)
Hemoglobin: 14.3 g/dL (ref 13.0–17.0)
MCV: 89.1 fL (ref 78.0–100.0)
Platelets: 187 10*3/uL (ref 150–400)
RBC: 4.6 MIL/uL (ref 4.22–5.81)
WBC: 14.5 10*3/uL — ABNORMAL HIGH (ref 4.0–10.5)

## 2013-04-03 MED ORDER — FENTANYL BOLUS VIA INFUSION
25.0000 ug | Freq: Four times a day (QID) | INTRAVENOUS | Status: DC | PRN
  Filled 2013-04-03: qty 100

## 2013-04-03 MED ORDER — MIDAZOLAM BOLUS VIA INFUSION
1.0000 mg | INTRAVENOUS | Status: DC | PRN
  Filled 2013-04-03: qty 2

## 2013-04-03 MED ORDER — SODIUM CHLORIDE 0.9 % IV SOLN
25.0000 ug/h | INTRAVENOUS | Status: DC
  Filled 2013-04-03: qty 50

## 2013-04-03 MED ORDER — FENTANYL BOLUS VIA INFUSION
25.0000 ug | Freq: Four times a day (QID) | INTRAVENOUS | Status: DC | PRN
  Administered 2013-04-04: 100 ug via INTRAVENOUS
  Administered 2013-04-04: 50 ug via INTRAVENOUS
  Administered 2013-04-04: 100 ug via INTRAVENOUS
  Administered 2013-04-04: 50 ug via INTRAVENOUS
  Administered 2013-04-05: 100 ug via INTRAVENOUS
  Filled 2013-04-03: qty 100

## 2013-04-03 MED ORDER — ALBUTEROL SULFATE (5 MG/ML) 0.5% IN NEBU
INHALATION_SOLUTION | RESPIRATORY_TRACT | Status: AC
  Administered 2013-04-03: 2.5 mg via RESPIRATORY_TRACT
  Filled 2013-04-03: qty 0.5

## 2013-04-03 MED ORDER — METOPROLOL TARTRATE 1 MG/ML IV SOLN
INTRAVENOUS | Status: AC
  Filled 2013-04-03: qty 5

## 2013-04-03 MED ORDER — METOPROLOL TARTRATE 1 MG/ML IV SOLN
5.0000 mg | Freq: Once | INTRAVENOUS | Status: AC
  Administered 2013-04-03: 5 mg via INTRAVENOUS

## 2013-04-03 MED ORDER — PROPOFOL 10 MG/ML IV EMUL
INTRAVENOUS | Status: AC
  Filled 2013-04-03: qty 100

## 2013-04-03 MED ORDER — METOPROLOL TARTRATE 1 MG/ML IV SOLN
2.5000 mg | INTRAVENOUS | Status: DC | PRN
  Administered 2013-04-03: 5 mg via INTRAVENOUS
  Filled 2013-04-03: qty 5

## 2013-04-03 MED ORDER — POTASSIUM CHLORIDE 10 MEQ/50ML IV SOLN
10.0000 meq | Freq: Once | INTRAVENOUS | Status: AC
  Administered 2013-04-03: 10 meq via INTRAVENOUS
  Filled 2013-04-03: qty 50

## 2013-04-03 MED ORDER — SODIUM CHLORIDE 0.9 % IV SOLN
1.0000 mg/h | INTRAVENOUS | Status: DC
  Filled 2013-04-03: qty 10

## 2013-04-03 MED ORDER — PROPOFOL 10 MG/ML IV EMUL
5.0000 ug/kg/min | INTRAVENOUS | Status: DC
  Administered 2013-04-03: 10 ug/kg/min via INTRAVENOUS
  Administered 2013-04-04: 5 ug/kg/min via INTRAVENOUS
  Filled 2013-04-03: qty 100

## 2013-04-03 NOTE — Progress Notes (Signed)
PULMONARY  / CRITICAL CARE MEDICINE  Name: Lee White MRN: [id number] DOB: 22-Aug-1955    ADMISSION DATE:  04/04/2013  REFERRING MD :  [facility]  PRIMARY SERVICE: PCCM  CHIEF COMPLAINT:  Cardiac Arrest  BRIEF PATIENT DESCRIPTION: 57 y/o, morbidly obese male admitted 9/8 from [facility] post cardiac arrest (>40 min).    SIGNIFICANT EVENTS / STUDIES:  9/08 - arrest at work, initial rhythm shock with AED, 40 mins CPR.  Intubated by EMS with #7 9/8 CATH : severe 3VD. Severe stenosis SVG OM1-OM2, total RCA, patent LIMA to LAD, EF 20%.   LINES / TUBES: OETT #7 9/8>>> R [place] CVL 9/8>>  CULTURES: Sputum 9/8>>> BCx1  9/8>>> UA 9/8>>> UDS 9/8>>>  ANTIBIOTICS: Unasyn (asp) 9/8>>>  SUBJECTIVE:  Paralyzed and sedated on vent. rewarm to start at 1230AM 9/10  VITAL SIGNS: Temp:  [90.1 F (32.3 C)-94.8 F (34.9 C)] 94.8 F (34.9 C) (09/10 0700) Pulse Rate:  [61-99] 99 (09/10 0800) Resp:  [22-24] 24 (09/10 0800) BP: (110-152)/(58-85) 152/85 mmHg (09/10 0800) SpO2:  [95 %-100 %] 99 % (09/10 0800) Arterial Line BP: (104-160)/(56-85) 135/74 mmHg (09/10 0700) FiO2 (%):  [60 %-70 %] 60 % (09/10 0800) Weight:  [143.41 kg (316 lb 2.6 oz)] 143.41 kg (316 lb 2.6 oz) (09/10 0500)  HEMODYNAMICS: CVP:  [0 mmHg-24 mmHg] 0 mmHg On Norepi  VENTILATOR SETTINGS: Vent Mode:  [-] PRVC FiO2 (%):  [60 %-70 %] 60 % Set Rate:  [24 bmp] 24 bmp Vt Set:  [580 mL] 580 mL PEEP:  [5 cmH20] 5 cmH20 Plateau Pressure:  [24 cmH20-28 cmH20] 24 cmH20  INTAKE / OUTPUT: Intake/Output     09/09 0701 - 09/10 0700 09/10 0701 - 09/11 0700   I.V. (mL/kg) 4133.6 (28.8)    NG/GT 120    IV Piggyback 450    Total Intake(mL/kg) 4703.6 (32.8)    Urine (mL/kg/hr) 1717 (0.5)    Emesis/NG output 200 (0.1)    Total Output 1917     Net +2786.6            PHYSICAL EXAMINATION: General:  Morbidly obese male on vent Neuro:  Sedated and paralyed HEENT:  Mm pink/moist, short thick neck,  OETT Cardiovascular:  s1s2 rrr, distant tones Lungs:  Scattered rhonchi Abdomen:  Obese /soft, bsx4 hypoactive Musculoskeletal:  No acute deformities Skin:  Warm/dry, abrasions on knees  LABS:  CBC Recent Labs     04/03/2013  1735   04/02/13  0405   04/03/13  0004  04/03/13  0408  04/03/13  0425  WBC  26.1*   --   22.1*   --    --    --   14.5*  HGB  17.1*   < >  16.1   < >  15.0  14.6  14.3  HCT  49.6   < >  47.1   < >  44.0  43.0  41.0  PLT  235   --   238   --    --    --   187   < > = values in this interval not displayed.   Coag's Recent Labs     03/25/2013  1735  04/09/2013  2000  04/02/13  0406  APTT  30  29  91*  INR  1.27  1.29  1.30   BMET Recent Labs     04/19/2013  2000  04/22/2013  2208   04/02/13  0405   04/02/13  2004  04/03/13  0004  04/03/13  0408  NA  133*  134*   < >  137   < >  140  140  141  K  5.1  4.2   < >  3.4*   < >  2.9*  2.9*  2.9*  CL  96  97   < >  99   < >  104  104  103  CO2  21  17*   --   15*   --    --    --    --   BUN  17  18   < >  19   < >  12  12  10   CREATININE  1.76*  1.64*   < >  1.40*   < >  1.10  1.00  1.00  GLUCOSE  223*  221*   < >  256*   < >  177*  155*  154*   < > = values in this interval not displayed.   Electrolytes Recent Labs     03/25/2013  2000  04/13/2013  2208  04/02/13  0405  CALCIUM  8.0*  7.8*  8.0*   Sepsis Markers No results found for this basename: LACTICACIDVEN, PROCALCITON, O2SATVEN,  in the last 72 hours ABG Recent Labs     03/27/2013  1657  04/02/13  0358  PHART  7.302*  7.302*  PCO2ART  50.2*  30.7*  PO2ART  217.0*  104.0*   Liver Enzymes Recent Labs     04/23/2013  1735  04/02/13  0405  04/03/13  0425  AST  109*  108*  118*  ALT  81*  73*  65*  ALKPHOS  77  51  42  BILITOT  0.6  0.6  0.7  ALBUMIN  3.5  3.1*  2.7*   Cardiac Enzymes Recent Labs     04/15/2013  1735  04/22/2013  2245  04/02/13  0405  TROPONINI  1.58*  2.48*  3.01*   Glucose Recent Labs     04/02/13  1348   04/02/13  1444  04/02/13  1558  04/02/13  1654  04/02/13  1735  04/02/13  1833  GLUCAP  230*  198*  203*  194*  177*  158*   Imaging  CXR: 9/9: pulm edema, stable support apparatus  ASSESSMENT / PLAN:  Principal Problem:   Cardiac arrest Active Problems:   Acute respiratory failure with hypoxia   Pulmonary edema   Cardiogenic shock   Acute renal failure   Oliguria   Encephalopathy acute   Cardiomyopathy, ischemic   Hyperglycemia   Shock liver   Metabolic acidosis   Aspiration into lower respiratory tract   PULMONARY A: Acute Respiratory Failure - in setting of cardiac arrest Pulmonary edema Possible Aspiration  P:   - Cont full vent support, titrate fiO2 to support sats >92%, decrease FiO2 to 50%. - 8cc/kg= 580cc. - F/u cxr in am - Scheduled  albuterol   CARDIOVASCULAR A:  Cardiac Arrest - initial rhythm shocked via AED.  First EMS witnessed rhythm asystole.  >40 minutes CPR Cardiogenic Shock - post arrest  Hx of CAD and pCABG CATH 9/8: severe 3VD. Severe stenosis SVG OM1-OM2, total RCA, patent LIMA to LAD, EF 20%. P:  - Appreciate cardiology input. - Cont Rewarming. - Pressors for MAP >65, levo at 15 mcg, will change parameters once patient is rewarmed.  RENAL A:   Acute renal insufficiency  Oliguria REsolved Mixed Acidosis persists P:   - Cont D5w with Bicarb at 75 until BMET results (being drawn). - K was 2.9 and not replaced since patient is rewarming, will replace if remains low on BMET being drawn right now.  GASTROINTESTINAL A:   Shock liver , elevated AST ALT P:   - Reassess LFT's in AM. - PPI  - Hold TF until rewarmed and mental status is reassessed.  HEMATOLOGIC A:   P:  - Assess CBC daily. - Heparin for DVT proph / SCD's.  INFECTIOUS A:   Purulent sputum - suspect aspiration  Fever - 100.6 on arrival to [facility] UDS neg  P:   - Cont empiric unasyn for ? Aspiration. - Assess BCx1 (only able to get one set) NTD.  ENDOCRINE A:    Hyperglycemia with no prior hx of DM P:   - ICU SSI protocol - Needed to start insulin drip am 9/8, will reevaluate if TF are to be started.  NEUROLOGIC A: Post anoxic acute encephalopathy P:   - Rewarming, continue versed/fentanyl/nimbex for now. - Will reassess neuro status once rewarmed.  TODAYS SUMMARY: 57 y.o.M s/p asystolic arrest d/t CAD and severe 3VD and STEMI with EF 20% and progression of CAD/stenosis in SVG from prior CABG.  Patient on artic sun protocol currently warming, will finish around noon today. Will continue bicarb drip for today until subsequent BMET (being drawn) is placed and will consider d/c, K replacement as needed and will assess neuro function after patient is rewarmed.    I have personally obtained a history, examined the patient, evaluated laboratory and imaging results, formulated the assessment and plan and placed orders.  CRITICAL CARE: The patient is critically ill with multiple organ systems failure and requires high complexity decision making for assessment and support, frequent evaluation and titration of therapies, application of advanced monitoring technologies and extensive interpretation of multiple databases. Critical Care Time devoted to patient care services described in this note is 40 minutes.   Alyson Reedy, M.D. [facility] Pulmonary/Critical Care Medicine. Pager: [phone]. After hours pager: [phone].

## 2013-04-03 NOTE — Progress Notes (Deleted)
RT note: RT placed patient on 20 PPM nitric in OR through Or ventilator. Order per DR. Donata Clay.

## 2013-04-03 NOTE — Progress Notes (Signed)
ANTICOAGULATION CONSULT NOTE - Follow Up Consult  Pharmacy Consult for Heparin Indication: MI  Allergies  Allergen Reactions  . Other Hives and Swelling    "Unknown medication from 30 years ago.  He has had various IV and Pain medications from previous performed procedures with no problems.  Given benadryl to counteract reaction-per patient's family."    Patient Measurements: Height: 5\' 10"  (177.8 cm) Weight: 316 lb 2.6 oz (143.41 kg) IBW/kg (Calculated) : 73 Heparin Dosing Weight: 107 kg  Vital Signs: Temp: 95.4 F (35.2 C) (09/10 0900) Temp src: Core (Comment) (09/10 0900) BP: 152/85 mmHg (09/10 0800) Pulse Rate: 97 (09/10 0900)  Labs:  Recent Labs  04/23/2013 1735  04/22/2013 2000  04/17/2013 2245  04/02/13 0405 04/02/13 0406  04/02/13 1230  04/03/13 0004 04/03/13 0408 04/03/13 0425 04/03/13 0916  HGB 17.1*  < >  --   --   --   < > 16.1  --   < >  --   < > 15.0 14.6 14.3 14.6  HCT 49.6  < >  --   --   --   < > 47.1  --   < >  --   < > 44.0 43.0 41.0 43.0  PLT 235  --   --   --   --   --  238  --   --   --   --   --   --  187  --   APTT 30  --  29  --   --   --   --  91*  --   --   --   --   --   --   --   LABPROT 15.6*  --  15.8*  --   --   --   --  15.9*  --   --   --   --   --   --   --   INR 1.27  --  1.29  --   --   --   --  1.30  --   --   --   --   --   --   --   HEPARINUNFRC  --   --   --   --   --   --  0.39  --   --  0.57  --   --   --  0.45  --   CREATININE 1.84*  < > 1.76*  < >  --   < > 1.40*  --   < >  --   < > 1.00 1.00  --  0.90  CKTOTAL 956*  --   --   --   --   --   --   --   --   --   --   --   --   --   --   CKMB 22.4*  --   --   --   --   --   --   --   --   --   --   --   --   --   --   TROPONINI 1.58*  --   --   --  2.48*  --  3.01*  --   --   --   --   --   --   --   --   < > = values in this interval not displayed.  Estimated Creatinine Clearance: 131.2 ml/min (by C-G  formula based on Cr of 0.9).   Assessment: 57 yo M with history CABG  admitted 04/02/2013 on transfer from [facility] post cardiac arrest (>40 min) with bystander CPR and AED shock.  PMH: HTN, CAD, Morbid obesity, HLD, CABG '03,  Anticoag: s/p MI on hypothermia protocol. Heparin level 0.45. CBC stable.  ID: Aspiration PNA on Unasyn. Cooled on hypothermia. WBC 33.1>>14.5.  CV: CAD, HLD, CABG '03, ICM, Cardiogenic shock. Post-cath hypothermia protocol. Start rewarming 9/10 at 0030. Meds:ASA, plavix, Norepi drip --Cath: Severe native 3VCAD with total occlusion of LMain and RCA, severe SVG stenosis, EF 20%  Endocrinology: No h/o DM. Hyperglycemia with CBG down to 126  (s/p insulin drip) on SSI, A1C 6.4.  Gastrointestinal / Nutrition: morbid obesity, shock liver with ALT 65 down, IV PPI  Neurology: Sedated on the vent, Fent/Versed, Nimbex. Assess neuro status when rewarmed  Nephrology: ARF, UOP now 0.5/24h, metabolic acidosis on bicarb drip. Scr 0.9 improved.    Pulmonary: VDRF s/p cardiac arrest, pulmonary edema  Hematology / Oncology  PTA Medication Issues  Best Practices: mouth care   Goal of Therapy:  Heparin level 0.3-0.7 units/ml Monitor platelets by anticoagulation protocol: Yes  Plan:  Continue heparin at 1000 units/hr    Takya Vandivier S. Merilynn Finland, PharmD, [facility] Clinical Staff Pharmacist Pager [phone]  Misty Stanley Stillinger 04/03/2013,10:07 AM

## 2013-04-03 NOTE — Progress Notes (Signed)
Rewarming started.  verifed by [employer]  RN

## 2013-04-03 NOTE — Progress Notes (Addendum)
Wasted 30cc versed. In sink from iv drip

## 2013-04-03 NOTE — Progress Notes (Signed)
eLink Physician-Brief Progress Note Patient Name: Lee White DOB: February 25, 1956 MRN: [id number]  Date of Service  04/03/2013   HPI/Events of Note  Patient has been and continues to be hypokalemic despite potassium replacement.  Now with K of 2.9 but is to be rewarmed in 1 hour.   eICU Interventions  Plan: 10 mEq over the next hour.  Hold potassium replacement while rewarming.   Intervention Category Intermediate Interventions: Electrolyte abnormality - evaluation and management  DETERDING,ELIZABETH 04/03/2013, 12:14 AM

## 2013-04-03 NOTE — Progress Notes (Signed)
PROGRESS NOTE  Subjective:   Lee White is a 57 yo with hx of CAD, CABG admitted yesterday following a cardiac arrest. Cath yesterday revealed subtotalled LM, small non dominant , occluded SVG to RCA, tight 90% stenosis in the SVG to OM, patent LIMA to distal LAD. His LV function is markedly depressed with EF of 20-25% ( akinetic apex, inferior wall)   Rewarming was started at 12:30 this am.    Objective:    Vital Signs:   Temp:  [90.1 F (32.3 C)-94.8 F (34.9 C)] 94.8 F (34.9 C) (09/10 0700) Pulse Rate:  [61-99] 99 (09/10 0800) Resp:  [22-24] 24 (09/10 0800) BP: (110-152)/(58-85) 152/85 mmHg (09/10 0800) SpO2:  [95 %-100 %] 99 % (09/10 0800) Arterial Line BP: (104-160)/(56-85) 135/74 mmHg (09/10 0700) FiO2 (%):  [60 %-70 %] 60 % (09/10 0600) Weight:  [316 lb 2.6 oz (143.41 kg)] 316 lb 2.6 oz (143.41 kg) (09/10 0500)      24-hour weight change: Weight change: -3 lb 13.4 oz (-1.741 kg)  Weight trends: Filed Weights   03/29/2013 1500 04/03/13 0500  Weight: 320 lb (145.151 kg) 316 lb 2.6 oz (143.41 kg)    Intake/Output:  09/09 0701 - 09/10 0700 In: 4703.6 [I.V.:4133.6; NG/GT:120; IV Piggyback:450] Out: 1917 [Urine:1717; Emesis/NG output:200]     Physical Exam: BP 152/85  Pulse 99  Temp(Src) 94.8 F (34.9 C) (Core (Comment))  Resp 24  Ht 5\' 10"  (1.778 m)  Wt 316 lb 2.6 oz (143.41 kg)  BMI 45.36 kg/m2  SpO2 99%  General: Vital signs reviewed and noted. Patient sedated, on vent, on [place] , at 35 Celsius at present  Head: intubated  Eyes: closed  Throat: intubated  Neck:  thick  Lungs:  on vent  Heart:  RR,   Abdomen:  Soft, non-tender, non-distended    Extremities: Trace edema, cool.   Neurologic: Sedated , on vent  Psych: Na     Labs: BMET:  Recent Labs  04/20/2013 2208  04/02/13 0405  04/03/13 0004 04/03/13 0408  NA 134*  < > 137  < > 140 141  K 4.2  < > 3.4*  < > 2.9* 2.9*  CL 97  < > 99  < > 104 103  CO2 17*  --  15*  --   --   --     GLUCOSE 221*  < > 256*  < > 155* 154*  BUN 18  < > 19  < > 12 10  CREATININE 1.64*  < > 1.40*  < > 1.00 1.00  CALCIUM 7.8*  --  8.0*  --   --   --   < > = values in this interval not displayed.  Liver function tests:  Recent Labs  04/02/13 0405 04/03/13 0425  AST 108* 118*  ALT 73* 65*  ALKPHOS 51 42  BILITOT 0.6 0.7  PROT 6.2 5.4*  ALBUMIN 3.1* 2.7*   No results found for this basename: LIPASE, AMYLASE,  in the last 72 hours  CBC:  Recent Labs  04/02/13 0405  04/03/13 0408 04/03/13 0425  WBC 22.1*  --   --  14.5*  HGB 16.1  < > 14.6 14.3  HCT 47.1  < > 43.0 41.0  MCV 92.5  --   --  89.1  PLT 238  --   --  187  < > = values in this interval not displayed.  Cardiac Enzymes:  Recent Labs  04/14/2013 1735 03/30/2013 2245 04/02/13 0405  CKTOTAL 956*  --   --   CKMB 22.4*  --   --   TROPONINI 1.58* 2.48* 3.01*    Coagulation Studies:  Recent Labs  04/23/2013 1735 04/14/2013 2000 04/02/13 0406  LABPROT 15.6* 15.8* 15.9*  INR 1.27 1.29 1.30    Other: No components found with this basename: POCBNP,  No results found for this basename: DDIMER,  in the last 72 hours  Recent Labs  04/15/2013 1735  HGBA1C 6.4*    Recent Labs  04/11/2013 1735  CHOL 170  HDL 37*  LDLCALC 101*  TRIG 159*  CHOLHDL 4.6   No results found for this basename: TSH, T4TOTAL, FREET3, T3FREE, THYROIDAB,  in the last 72 hours No results found for this basename: VITAMINB12, FOLATE, FERRITIN, TIBC, IRON, RETICCTPCT,  in the last 72 hours   Other results:  Tele :  NSR / sinus brady at 57  Medications:    Infusions: . sodium chloride 40 mL/hr at 04/02/13 0800  . sodium chloride 10 mL/hr at 04/03/13 0700  . sodium chloride 10 mL/hr at 04/03/13 0700  . sodium chloride 10 mL/hr at 04/03/13 0700  . cisatracurium (NIMBEX) infusion 0.999 mcg/kg/min (04/03/13 0700)  . fentaNYL infusion INTRAVENOUS 200 mcg/hr (04/03/13 0700)  . heparin 1,000 Units/hr (04/03/13 0700)  . insulin  (NOVOLIN-R) infusion Stopped (04/03/13 0006)  . midazolam (VERSED) infusion 4 mg/hr (04/03/13 0700)  . norepinephrine (LEVOPHED) Adult infusion 15.04 mcg/min (04/03/13 0700)  .  sodium bicarbonate  infusion 1000 mL 75 mL/hr at 04/03/13 0700    Scheduled Medications: . albuterol  2.5 mg Nebulization Q6H  . ampicillin-sulbactam (UNASYN) IV  1.5 g Intravenous Q6H  . antiseptic oral rinse  15 mL Mouth Rinse QID  . artificial tears  1 application Both Eyes Q8H  . aspirin  81 mg Oral Daily  . chlorhexidine  15 mL Mouth Rinse BID  . clopidogrel  75 mg Oral Q breakfast  . insulin aspart  2-6 Units Subcutaneous Q4H  . pantoprazole (PROTONIX) IV  40 mg Intravenous Q24H  . sodium chloride  3 mL Intravenous Q12H    Assessment/ Plan:      Cardiac arrest:  On vent, [facility]. Cath shows occluded graft to RCA and a tight stenosis of the graft to OM.  Will   assess his mental status once he warms up.  He had CPR for ~45 minutes.   Rewarming has started.. Plans per PCCM  Erythrocytosis:  I suspect that he has sleep apnea    Acute respiratory failure with hypoxia- plans per PCCM    Acute renal failure   Oliguria   Encephalopathy acute   Disposition:   Length of Stay: 2  Vesta Mixer, Montez Hageman., MD, [facility] 04/03/2013, 8:02 AM Office [phone] Pager [phone]

## 2013-04-04 ENCOUNTER — Inpatient Hospital Stay (HOSPITAL_COMMUNITY): Payer: BC Managed Care – PPO

## 2013-04-04 DIAGNOSIS — J9600 Acute respiratory failure, unspecified whether with hypoxia or hypercapnia: Secondary | ICD-10-CM

## 2013-04-04 DIAGNOSIS — G931 Anoxic brain damage, not elsewhere classified: Secondary | ICD-10-CM

## 2013-04-04 LAB — CBC
MCHC: 33.4 g/dL (ref 30.0–36.0)
Platelets: 142 10*3/uL — ABNORMAL LOW (ref 150–400)
RDW: 14.8 % (ref 11.5–15.5)

## 2013-04-04 LAB — BLOOD GAS, ARTERIAL
Bicarbonate: 24.9 mEq/L — ABNORMAL HIGH (ref 20.0–24.0)
MECHVT: 580 mL
O2 Saturation: 99.3 %
PEEP: 5 cmH2O
Patient temperature: 98.6
TCO2: 25.9 mmol/L (ref 0–100)
pH, Arterial: 7.495 — ABNORMAL HIGH (ref 7.350–7.450)

## 2013-04-04 LAB — BASIC METABOLIC PANEL
BUN: 24 mg/dL — ABNORMAL HIGH (ref 6–23)
GFR calc Af Amer: 56 mL/min — ABNORMAL LOW (ref >90)
GFR calc non Af Amer: 48 mL/min — ABNORMAL LOW (ref >90)
Potassium: 4.5 mEq/L (ref 3.5–5.1)
Sodium: 140 mEq/L (ref 135–145)

## 2013-04-04 LAB — GLUCOSE, CAPILLARY
Glucose-Capillary: 102 mg/dL — ABNORMAL HIGH (ref 70–99)
Glucose-Capillary: 136 mg/dL — ABNORMAL HIGH (ref 70–99)
Glucose-Capillary: 135 mg/dL — ABNORMAL HIGH (ref 70–99)
Glucose-Capillary: 119 mg/dL — ABNORMAL HIGH (ref 70–99)

## 2013-04-04 LAB — HEPARIN LEVEL (UNFRACTIONATED): Heparin Unfractionated: 0.24 IU/mL — ABNORMAL LOW (ref 0.30–0.70)

## 2013-04-04 LAB — MAGNESIUM: Magnesium: 1.7 mg/dL (ref 1.5–2.5)

## 2013-04-04 MED ORDER — FENTANYL BOLUS VIA INFUSION
50.0000 ug | INTRAVENOUS | Status: DC | PRN
  Filled 2013-04-04: qty 50

## 2013-04-04 MED ORDER — PRO-STAT SUGAR FREE PO LIQD
60.0000 mL | Freq: Three times a day (TID) | ORAL | Status: DC
  Administered 2013-04-04 – 2013-04-05 (×3): 60 mL
  Filled 2013-04-04 (×6): qty 60

## 2013-04-04 MED ORDER — VITAL HIGH PROTEIN PO LIQD
1000.0000 mL | ORAL | Status: DC
  Administered 2013-04-04: 1000 mL
  Filled 2013-04-04 (×4): qty 1000

## 2013-04-04 MED ORDER — SODIUM CHLORIDE 0.9 % IV SOLN
25.0000 ug/h | INTRAVENOUS | Status: DC
  Administered 2013-04-04: 50 ug/h via INTRAVENOUS
  Administered 2013-04-04: 150 ug/h via INTRAVENOUS
  Administered 2013-04-05: 300 ug/h via INTRAVENOUS
  Filled 2013-04-04 (×2): qty 50

## 2013-04-04 MED ORDER — VITAL AF 1.2 CAL PO LIQD: 1000.0000 mL | ORAL | Status: DC

## 2013-04-04 NOTE — Consult Note (Addendum)
NEURO HOSPITALIST CONSULT NOTE    Reason for Consult: coma s/p cardiac arrest  HPI:                                                                                                                                          Lee White is an 57 y.o. male is an 57 y.o. male with a past medical history significant for HTN, hyperlipidemia, CAD s/p CABG >, admitted 04/22/2013 with out of hospital cardiac arrest with estimated down time >40 minutes. He was rewarmed yesterday and has been out of sedation since this morning. ICU staff reports no evidence of neurological function on exam. He just completed EEG that showed diffuse generalized delta-theta slowing but no electrographic seizures. CT brain pending. A neurological consult was requested as patient is not waking up.  Past Medical History  Diagnosis Date  . S/P CABG (coronary artery bypass graft)   . HTN (hypertension)   . HLD (hyperlipidemia)     History reviewed. No pertinent past surgical history.  Family History  Problem Relation Age of Onset  . Cancer Father   . Stroke Mother     Social History:  reports that he has never smoked. He does not have any smokeless tobacco history on file. He reports that he does not drink alcohol or use illicit drugs.  Allergies  Allergen Reactions  . Other Hives and Swelling    "Unknown medication from 30 years ago.  He has had various IV and Pain medications from previous performed procedures with no problems.  Given benadryl to counteract reaction-per patient's family."    MEDICATIONS:                                                                                                                     I have reviewed the patient's current medications.   ROS: unable to obtain  History obtained from chart review and family    Physical exam:  intubated on the vent. Blood pressure 112/63, pulse 110, temperature 98.5 F (36.9 C), temperature source Core (Comment), resp. rate 21, height 5\' 10"  (1.778 m), weight 143.41 kg (316 lb 2.6 oz), SpO2 95.00%. Head: normocephalic. Neck: supple, no bruits, no JVD. Cardiac: no murmurs. Lungs: clear. Abdomen: soft, no tender, no mass. Extremities: no edema.   Neurologic Examination:                                                                                                      Mental Status: off sedatives Profoundly comatose. Cranial Nerves:  Fixed unreactive pupils measuring 1-2 mm. No gaze preference. Absent EOM on Doll's maneuver. No corneal reflexes. Face symmetric. Tongue: intubated. Motor: No spontaneous or pain induced movements. Tone and bulk:normal tone throughout; no atrophy noted Sensory: doesn't react to pain Deep Tendon Reflexes:  Couldn't elicit. Plantars: Right: mute   Left: mute Cerebellar: Unable to test Gait:  Unable to test. CV: pulses palpable throughout    Lab Results  Component Value Date/Time   CHOL 170 03/28/2013  5:35 PM    Results for orders placed during the hospital encounter of 04/23/2013 (from the past 48 hour(s))  GLUCOSE, CAPILLARY     Status: Abnormal   Collection Time    04/02/13 11:47 AM      Result Value Range   Glucose-Capillary 220 (*) 70 - 99 mg/dL  POCT I-STAT, CHEM 8     Status: Abnormal   Collection Time    04/02/13 12:12 PM      Result Value Range   Sodium 140  135 - 145 mEq/L   Potassium 3.1 (*) 3.5 - 5.1 mEq/L   Chloride 102  96 - 112 mEq/L   BUN 16  6 - 23 mg/dL   Creatinine, Ser 4.54  0.50 - 1.35 mg/dL   Glucose, Bld 098 (*) 70 - 99 mg/dL   Calcium, Ion 1.19 (*) 1.12 - 1.23 mmol/L   TCO2 20  0 - 100 mmol/L   Hemoglobin 16.7  13.0 - 17.0 g/dL   HCT 14.7  82.9 - 56.2 %  HEPARIN LEVEL (UNFRACTIONATED)     Status: None   Collection Time    04/02/13 12:30 PM      Result Value Range   Heparin Unfractionated 0.57  0.30 -  0.70 IU/mL   Comment:            IF HEPARIN RESULTS ARE BELOW     EXPECTED VALUES, AND PATIENT     DOSAGE HAS BEEN CONFIRMED,     SUGGEST FOLLOW UP TESTING     OF ANTITHROMBIN III LEVELS.  GLUCOSE, CAPILLARY     Status: Abnormal   Collection Time    04/02/13  1:10 PM      Result Value Range   Glucose-Capillary 209 (*) 70 - 99 mg/dL  GLUCOSE, CAPILLARY     Status: Abnormal   Collection Time    04/02/13  1:48 PM      Result Value Range  Glucose-Capillary 230 (*) 70 - 99 mg/dL  GLUCOSE, CAPILLARY     Status: Abnormal   Collection Time    04/02/13  2:44 PM      Result Value Range   Glucose-Capillary 198 (*) 70 - 99 mg/dL  GLUCOSE, CAPILLARY     Status: Abnormal   Collection Time    04/02/13  3:58 PM      Result Value Range   Glucose-Capillary 203 (*) 70 - 99 mg/dL  POCT I-STAT, CHEM 8     Status: Abnormal   Collection Time    04/02/13  4:18 PM      Result Value Range   Sodium 140  135 - 145 mEq/L   Potassium 2.9 (*) 3.5 - 5.1 mEq/L   Chloride 104  96 - 112 mEq/L   BUN 14  6 - 23 mg/dL   Creatinine, Ser 1.61  0.50 - 1.35 mg/dL   Glucose, Bld 096 (*) 70 - 99 mg/dL   Calcium, Ion 0.45 (*) 1.12 - 1.23 mmol/L   TCO2 19  0 - 100 mmol/L   Hemoglobin 16.0  13.0 - 17.0 g/dL   HCT 40.9  81.1 - 91.4 %  POCT I-STAT, CHEM 8     Status: Abnormal   Collection Time    04/02/13  4:33 PM      Result Value Range   Sodium 140  135 - 145 mEq/L   Potassium 2.8 (*) 3.5 - 5.1 mEq/L   Chloride 103  96 - 112 mEq/L   BUN 15  6 - 23 mg/dL   Creatinine, Ser 7.82  0.50 - 1.35 mg/dL   Glucose, Bld 956 (*) 70 - 99 mg/dL   Calcium, Ion 2.13 (*) 1.12 - 1.23 mmol/L   TCO2 19  0 - 100 mmol/L   Hemoglobin 16.0  13.0 - 17.0 g/dL   HCT 08.6  57.8 - 46.9 %  GLUCOSE, CAPILLARY     Status: Abnormal   Collection Time    04/02/13  4:54 PM      Result Value Range   Glucose-Capillary 194 (*) 70 - 99 mg/dL  GLUCOSE, CAPILLARY     Status: Abnormal   Collection Time    04/02/13  5:35 PM      Result Value  Range   Glucose-Capillary 177 (*) 70 - 99 mg/dL  GLUCOSE, CAPILLARY     Status: Abnormal   Collection Time    04/02/13  6:33 PM      Result Value Range   Glucose-Capillary 158 (*) 70 - 99 mg/dL  POCT I-STAT, CHEM 8     Status: Abnormal   Collection Time    04/02/13  8:04 PM      Result Value Range   Sodium 140  135 - 145 mEq/L   Potassium 2.9 (*) 3.5 - 5.1 mEq/L   Chloride 104  96 - 112 mEq/L   BUN 12  6 - 23 mg/dL   Creatinine, Ser 6.29  0.50 - 1.35 mg/dL   Glucose, Bld 528 (*) 70 - 99 mg/dL   Calcium, Ion 4.13 (*) 1.12 - 1.23 mmol/L   TCO2 20  0 - 100 mmol/L   Hemoglobin 15.6  13.0 - 17.0 g/dL   HCT 24.4  01.0 - 27.2 %  GLUCOSE, CAPILLARY     Status: Abnormal   Collection Time    04/02/13  9:08 PM      Result Value Range   Glucose-Capillary 145 (*) 70 - 99 mg/dL  GLUCOSE, CAPILLARY     Status: Abnormal   Collection Time    04/02/13 10:12 PM      Result Value Range   Glucose-Capillary 138 (*) 70 - 99 mg/dL  GLUCOSE, CAPILLARY     Status: Abnormal   Collection Time    04/02/13 11:04 PM      Result Value Range   Glucose-Capillary 135 (*) 70 - 99 mg/dL  POCT I-STAT, CHEM 8     Status: Abnormal   Collection Time    04/03/13 12:04 AM      Result Value Range   Sodium 140  135 - 145 mEq/L   Potassium 2.9 (*) 3.5 - 5.1 mEq/L   Chloride 104  96 - 112 mEq/L   BUN 12  6 - 23 mg/dL   Creatinine, Ser 4.09  0.50 - 1.35 mg/dL   Glucose, Bld 811 (*) 70 - 99 mg/dL   Calcium, Ion 9.14 (*) 1.12 - 1.23 mmol/L   TCO2 21  0 - 100 mmol/L   Hemoglobin 15.0  13.0 - 17.0 g/dL   HCT 78.2  95.6 - 21.3 %  GLUCOSE, CAPILLARY     Status: Abnormal   Collection Time    04/03/13  3:07 AM      Result Value Range   Glucose-Capillary 124 (*) 70 - 99 mg/dL  POCT I-STAT, CHEM 8     Status: Abnormal   Collection Time    04/03/13  4:08 AM      Result Value Range   Sodium 141  135 - 145 mEq/L   Potassium 2.9 (*) 3.5 - 5.1 mEq/L   Chloride 103  96 - 112 mEq/L   BUN 10  6 - 23 mg/dL   Creatinine,  Ser 0.86  0.50 - 1.35 mg/dL   Glucose, Bld 578 (*) 70 - 99 mg/dL   Calcium, Ion 4.69 (*) 1.12 - 1.23 mmol/L   TCO2 22  0 - 100 mmol/L   Hemoglobin 14.6  13.0 - 17.0 g/dL   HCT 62.9  52.8 - 41.3 %  HEPARIN LEVEL (UNFRACTIONATED)     Status: None   Collection Time    04/03/13  4:25 AM      Result Value Range   Heparin Unfractionated 0.45  0.30 - 0.70 IU/mL   Comment:            IF HEPARIN RESULTS ARE BELOW     EXPECTED VALUES, AND PATIENT     DOSAGE HAS BEEN CONFIRMED,     SUGGEST FOLLOW UP TESTING     OF ANTITHROMBIN III LEVELS.  CBC     Status: Abnormal   Collection Time    04/03/13  4:25 AM      Result Value Range   WBC 14.5 (*) 4.0 - 10.5 K/uL   RBC 4.60  4.22 - 5.81 MIL/uL   Hemoglobin 14.3  13.0 - 17.0 g/dL   HCT 24.4  01.0 - 27.2 %   MCV 89.1  78.0 - 100.0 fL   MCH 31.1  26.0 - 34.0 pg   MCHC 34.9  30.0 - 36.0 g/dL   RDW 53.6  64.4 - 03.4 %   Platelets 187  150 - 400 K/uL  HEPATIC FUNCTION PANEL     Status: Abnormal   Collection Time    04/03/13  4:25 AM      Result Value Range   Total Protein 5.4 (*) 6.0 - 8.3 g/dL   Albumin 2.7 (*) 3.5 - 5.2 g/dL  AST 118 (*) 0 - 37 U/L   ALT 65 (*) 0 - 53 U/L   Alkaline Phosphatase 42  39 - 117 U/L   Total Bilirubin 0.7  0.3 - 1.2 mg/dL   Bilirubin, Direct 0.2  0.0 - 0.3 mg/dL   Indirect Bilirubin 0.5  0.3 - 0.9 mg/dL  GLUCOSE, CAPILLARY     Status: Abnormal   Collection Time    04/03/13  8:24 AM      Result Value Range   Glucose-Capillary 126 (*) 70 - 99 mg/dL  POCT I-STAT, CHEM 8     Status: Abnormal   Collection Time    04/03/13  9:16 AM      Result Value Range   Sodium 140  135 - 145 mEq/L   Potassium 3.4 (*) 3.5 - 5.1 mEq/L   Chloride 102  96 - 112 mEq/L   BUN 9  6 - 23 mg/dL   Creatinine, Ser 1.61  0.50 - 1.35 mg/dL   Glucose, Bld 096 (*) 70 - 99 mg/dL   Calcium, Ion 0.45 (*) 1.12 - 1.23 mmol/L   TCO2 27  0 - 100 mmol/L   Hemoglobin 14.6  13.0 - 17.0 g/dL   HCT 40.9  81.1 - 91.4 %  GLUCOSE, CAPILLARY      Status: Abnormal   Collection Time    04/03/13 12:30 PM      Result Value Range   Glucose-Capillary 105 (*) 70 - 99 mg/dL  POCT I-STAT, CHEM 8     Status: Abnormal   Collection Time    04/03/13  1:19 PM      Result Value Range   Sodium 140  135 - 145 mEq/L   Potassium 4.5  3.5 - 5.1 mEq/L   Chloride 103  96 - 112 mEq/L   BUN 11  6 - 23 mg/dL   Creatinine, Ser 7.82  0.50 - 1.35 mg/dL   Glucose, Bld 956 (*) 70 - 99 mg/dL   Calcium, Ion 2.13 (*) 1.12 - 1.23 mmol/L   TCO2 22  0 - 100 mmol/L   Hemoglobin 15.3  13.0 - 17.0 g/dL   HCT 08.6  57.8 - 46.9 %  GLUCOSE, CAPILLARY     Status: Abnormal   Collection Time    04/03/13  7:33 PM      Result Value Range   Glucose-Capillary 107 (*) 70 - 99 mg/dL  GLUCOSE, CAPILLARY     Status: Abnormal   Collection Time    04/03/13 11:35 PM      Result Value Range   Glucose-Capillary 113 (*) 70 - 99 mg/dL  GLUCOSE, CAPILLARY     Status: Abnormal   Collection Time    04/04/13  4:24 AM      Result Value Range   Glucose-Capillary 124 (*) 70 - 99 mg/dL  HEPARIN LEVEL (UNFRACTIONATED)     Status: Abnormal   Collection Time    04/04/13  4:25 AM      Result Value Range   Heparin Unfractionated 0.24 (*) 0.30 - 0.70 IU/mL   Comment:            IF HEPARIN RESULTS ARE BELOW     EXPECTED VALUES, AND PATIENT     DOSAGE HAS BEEN CONFIRMED,     SUGGEST FOLLOW UP TESTING     OF ANTITHROMBIN III LEVELS.  CBC     Status: Abnormal   Collection Time    04/04/13  4:25 AM  Result Value Range   WBC 8.5  4.0 - 10.5 K/uL   RBC 4.00 (*) 4.22 - 5.81 MIL/uL   Hemoglobin 12.3 (*) 13.0 - 17.0 g/dL   Comment: REPEATED TO VERIFY   HCT 36.8 (*) 39.0 - 52.0 %   MCV 92.0  78.0 - 100.0 fL   MCH 30.8  26.0 - 34.0 pg   MCHC 33.4  30.0 - 36.0 g/dL   RDW 81.1  91.4 - 78.2 %   Platelets 142 (*) 150 - 400 K/uL   Comment: REPEATED TO VERIFY  BASIC METABOLIC PANEL     Status: Abnormal   Collection Time    04/04/13  4:25 AM      Result Value Range   Sodium 140  135  - 145 mEq/L   Potassium 4.5  3.5 - 5.1 mEq/L   Chloride 103  96 - 112 mEq/L   CO2 25  19 - 32 mEq/L   Glucose, Bld 131 (*) 70 - 99 mg/dL   BUN 24 (*) 6 - 23 mg/dL   Comment: DELTA CHECK NOTED   Creatinine, Ser 1.56 (*) 0.50 - 1.35 mg/dL   Calcium 8.0 (*) 8.4 - 10.5 mg/dL   GFR calc non Af Amer 48 (*) >90 mL/min   GFR calc Af Amer 56 (*) >90 mL/min   Comment: (NOTE)     The eGFR has been calculated using the CKD EPI equation.     This calculation has not been validated in all clinical situations.     eGFR's persistently <90 mL/min signify possible Chronic Kidney     Disease.  MAGNESIUM     Status: None   Collection Time    04/04/13  4:25 AM      Result Value Range   Magnesium 1.7  1.5 - 2.5 mg/dL  PHOSPHORUS     Status: None   Collection Time    04/04/13  4:25 AM      Result Value Range   Phosphorus 2.9  2.3 - 4.6 mg/dL  BLOOD GAS, ARTERIAL     Status: Abnormal   Collection Time    04/04/13  5:00 AM      Result Value Range   FIO2 50.00     Delivery systems VENTILATOR     Mode PRESSURE REGULATED VOLUME CONTROL     VT 580     Rate 24     Peep/cpap 5.0     pH, Arterial 7.495 (*) 7.350 - 7.450   pCO2 arterial 32.6 (*) 35.0 - 45.0 mmHg   pO2, Arterial 103.0 (*) 80.0 - 100.0 mmHg   Bicarbonate 24.9 (*) 20.0 - 24.0 mEq/L   TCO2 25.9  0 - 100 mmol/L   Acid-Base Excess 1.8  0.0 - 2.0 mmol/L   O2 Saturation 99.3     Patient temperature 98.6     Collection site A-LINE     Drawn by [phone]     Sample type ARTERIAL DRAW     Allens test (pass/fail) PASS  PASS  GLUCOSE, CAPILLARY     Status: Abnormal   Collection Time    04/04/13  7:54 AM      Result Value Range   Glucose-Capillary 102 (*) 70 - 99 mg/dL    Dg Chest Port 1 View  04/04/2013   CLINICAL DATA:  Evaluate endotracheal tube position.  EXAM: PORTABLE CHEST - 1 VIEW  COMPARISON:  04/02/2013  FINDINGS: Endotracheal tube terminates at the level of the clavicles, 5.7 cm above Carina.  Nasogastric tube poorly visualized  distally but extends off the inferior aspect of the film.  External pacer in place.  Cardiomegaly, exaggerated by AP portable technique. Probable small left pleural effusion. No pneumothorax. Improved interstitial edema, mild. Patchy lower lobe predominant airspace disease is greater on the left than right and slightly improved. Remote right rib trauma. Right-sided subclavian line terminates at the high SVC.  IMPRESSION: Improved congestive heart failure.  Improved bibasilar airspace disease. Favor pulmonary edema and or atelectasis.  Persistent small left pleural effusion.   Electronically Signed   By: Jeronimo Greaves   On: 04/04/2013 07:37     Assessment/Plan: Profoundly comatose state post anoxic brain injury. No evidence of electrographic seizures. Unfortunately, his neuro-exam is very concerning and I believe the potential for meaningful neurological recovery from this catastrophic event is virtually no existent.   Wyatt Portela ,MD [facility] [phone]  04/04/2013, 11:31 AM

## 2013-04-04 NOTE — Progress Notes (Signed)
Spoke with neurology, EEG has no seizure activity, consistent with severe anoxic injury.  Neuro feels that any chances at meaningful recovery are non-existent. I spent some more times with the family, the wife feels that trach/peg are not what the patient would want.  I recommended DNR and terminal extubation when the family is ready.  There is another son who is not here yet and they would like to have him here prior to withdrawal.  After discussion, I firmly believe that they finally understand the situation and are just awaiting the arrival of the other son then they will make decisions but they are unwilling to make any decisions at this time.  Will revisit when other son is here.  Additional CC time of 40 min.  Alyson Reedy, M.D. [facility] Pulmonary/Critical Care Medicine. Pager: [phone]. After hours pager: [phone].

## 2013-04-04 NOTE — Progress Notes (Addendum)
NUTRITION FOLLOW UP  Intervention:   1. Enteral nutrition; initiate Vital HP @ 20 mL/hr continuous. Advance by 10 mL q 6 hrs to 50 goal with Prostat 60 mL TID to provide 1800 kcal, 195g protein, 1003 mL free water. 2.  Nutrition-related medication; recommend gentle bowel regimen if no BM after initiation of feeds.   Nutrition Dx:   Inadequate oral intake, ongoing.   Monitor:   1. Enteral nutrition; initiation with tolerance. Enteral nutrition to provide 60-70% of estimated calorie needs (22-25 kcals/kg ideal body weight) and 100% of estimated protein needs, based on ASPEN guidelines for permissive underfeeding in critically ill obese individuals.  2. Wt/wt change; monitor trends  Assessment:   Pt admitted with cardiac arrest, now with possible anoxic brain injury. Pt remains intubated.  He has been re-warmed.  Tech at bedside for EEG.  Patient is currently intubated on ventilator support.  MV: 11.8 L/min Temp:Temp (24hrs), Avg:98.5 F (36.9 C), Min:96.8 F (36 C), Max:99.5 F (37.5 C)  Propofol: off for several hrs, no plans to resume.  RN reports decreased bowel sounds and previously increased output from NGT.  Output has decreased to ~30 mL today.  No BM since admission. RN requesting slow advancement.   Height: Ht Readings from Last 1 Encounters:  04/02/2013 5\' 10"  (1.778 m)    Weight Status:   Wt Readings from Last 1 Encounters:  04/03/13 316 lb 2.6 oz (143.41 kg)  Admission wt: 320 lbs  Re-estimated needs:  Kcal: 2514  Permissive underfeeding: 1610-9604  Protein: 188g  Fluid: >2.2 L/day  Skin: intact  Diet Order:  NPO   Intake/Output Summary (Last 24 hours) at 04/04/13 1031 Last data filed at 04/04/13 1000  Gross per 24 hour  Intake 1996.44 ml  Output    995 ml  Net 1001.44 ml    Last BM: PTA   Labs:   Recent Labs Lab 04/03/2013 2208  04/02/13 0405  04/03/13 0916 04/03/13 1319 04/04/13 0425  NA 134*  < > 137  < > 140 140 140  K 4.2  < > 3.4*   < > 3.4* 4.5 4.5  CL 97  < > 99  < > 102 103 103  CO2 17*  --  15*  --   --   --  25  BUN 18  < > 19  < > 9 11 24*  CREATININE 1.64*  < > 1.40*  < > 0.90 1.10 1.56*  CALCIUM 7.8*  --  8.0*  --   --   --  8.0*  MG  --   --   --   --   --   --  1.7  PHOS  --   --   --   --   --   --  2.9  GLUCOSE 221*  < > 256*  < > 159* 143* 131*  < > = values in this interval not displayed.  CBG (last 3)   Recent Labs  04/03/13 2335 04/04/13 0424 04/04/13 0754  GLUCAP 113* 124* 102*    Scheduled Meds: . albuterol  2.5 mg Nebulization Q6H  . ampicillin-sulbactam (UNASYN) IV  1.5 g Intravenous Q6H  . antiseptic oral rinse  15 mL Mouth Rinse QID  . aspirin  81 mg Oral Daily  . chlorhexidine  15 mL Mouth Rinse BID  . clopidogrel  75 mg Oral Q breakfast  . insulin aspart  2-6 Units Subcutaneous Q4H  . pantoprazole (PROTONIX) IV  40 mg Intravenous  Q24H  . sodium chloride  3 mL Intravenous Q12H    Continuous Infusions: . sodium chloride 40 mL/hr at 04/02/13 0800  . sodium chloride 10 mL/hr at 04/04/13 0700  . sodium chloride 20 mL/hr at 04/04/13 0700  . sodium chloride 20 mL/hr at 04/04/13 0700  . heparin 1,300 Units/hr (04/04/13 1012)  . insulin (NOVOLIN-R) infusion Stopped (04/03/13 0006)  . norepinephrine (LEVOPHED) Adult infusion Stopped (04/03/13 1300)  .  sodium bicarbonate  infusion 1000 mL Stopped (04/03/13 1200)    Loyce Dys, MS RD LDN Clinical Inpatient Dietitian Pager: [phone] Weekend/After hours pager: [phone]

## 2013-04-04 NOTE — Progress Notes (Signed)
PROGRESS NOTE  Subjective:   Lee White is a 57 yo with hx of CAD, CABG admitted yesterday following a cardiac arrest. Cath yesterday revealed subtotalled LM, small non dominant , occluded SVG to RCA, tight 90% stenosis in the SVG to OM, patent LIMA to distal LAD. His LV function is markedly depressed with EF of 20-25% ( akinetic apex, inferior wall)   He was rewarmed yesterday. Sedatives turned off this am.  Still on vent.  Opens eyes, does not follow commands  Objective:    Vital Signs:   Temp:  [96.1 F (35.6 C)-99.5 F (37.5 C)] 98.4 F (36.9 C) (09/11 0800) Pulse Rate:  [98-160] 100 (09/11 0900) Resp:  [13-24] 24 (09/11 0900) BP: (92-147)/(55-85) 112/63 mmHg (09/11 0737) SpO2:  [94 %-100 %] 95 % (09/11 0900) Arterial Line BP: (83-186)/(51-105) 119/62 mmHg (09/11 0900) FiO2 (%):  [40 %-60 %] 40 % (09/11 0800)      24-hour weight change: Weight change:   Weight trends: Filed Weights   04/08/2013 1500 04/03/13 0500  Weight: 320 lb (145.151 kg) 316 lb 2.6 oz (143.41 kg)    Intake/Output:  09/10 0701 - 09/11 0700 In: 2336.7 [I.V.:2086.7; NG/GT:100; IV Piggyback:150] Out: 930 [Urine:680; Emesis/NG output:250] Total I/O In: 187.2 [I.V.:127.2; NG/GT:60] Out: 100 [Urine:100]   Physical Exam: BP 112/63  Pulse 100  Temp(Src) 98.4 F (36.9 C) (Core (Comment))  Resp 24  Ht 5\' 10"  (1.778 m)  Wt 316 lb 2.6 oz (143.41 kg)  BMI 45.36 kg/m2  SpO2 95%  General: Vital signs reviewed and noted. Patient i s on the vent.    Head: intubated  Eyes: closed  Throat: intubated  Neck:  thick  Lungs:  on vent, wheezing  Heart:  RR,   Abdomen:  Soft, non-tender, non-distended    Extremities: Trace edema, cool.   Neurologic:  not following commands, on vent  Psych: Na     Labs: BMET:  Recent Labs  04/02/13 0405  04/03/13 1319 04/04/13 0425  NA 137  < > 140 140  K 3.4*  < > 4.5 4.5  CL 99  < > 103 103  CO2 15*  --   --  25  GLUCOSE 256*  < > 143* 131*  BUN 19  < > 11  24*  CREATININE 1.40*  < > 1.10 1.56*  CALCIUM 8.0*  --   --  8.0*  MG  --   --   --  1.7  PHOS  --   --   --  2.9  < > = values in this interval not displayed.  Liver function tests:  Recent Labs  04/02/13 0405 04/03/13 0425  AST 108* 118*  ALT 73* 65*  ALKPHOS 51 42  BILITOT 0.6 0.7  PROT 6.2 5.4*  ALBUMIN 3.1* 2.7*   No results found for this basename: LIPASE, AMYLASE,  in the last 72 hours  CBC:  Recent Labs  04/03/13 0425  04/03/13 1319 04/04/13 0425  WBC 14.5*  --   --  8.5  HGB 14.3  < > 15.3 12.3*  HCT 41.0  < > 45.0 36.8*  MCV 89.1  --   --  92.0  PLT 187  --   --  142*  < > = values in this interval not displayed.  Cardiac Enzymes:  Recent Labs  04/18/2013 1735 03/30/2013 2245 04/02/13 0405  CKTOTAL 956*  --   --   CKMB 22.4*  --   --   TROPONINI 1.58*  2.48* 3.01*    Coagulation Studies:  Recent Labs  04/23/2013 1735 03/26/2013 2000 04/02/13 0406  LABPROT 15.6* 15.8* 15.9*  INR 1.27 1.29 1.30    Other: No components found with this basename: POCBNP,  No results found for this basename: DDIMER,  in the last 72 hours  Recent Labs  04/07/2013 1735  HGBA1C 6.4*    Recent Labs  04/04/2013 1735  CHOL 170  HDL 37*  LDLCALC 101*  TRIG 159*  CHOLHDL 4.6   No results found for this basename: TSH, T4TOTAL, FREET3, T3FREE, THYROIDAB,  in the last 72 hours No results found for this basename: VITAMINB12, FOLATE, FERRITIN, TIBC, IRON, RETICCTPCT,  in the last 72 hours   Other results:  Tele :  NSR / sinus brady at 57  Medications:    Infusions: . sodium chloride 40 mL/hr at 04/02/13 0800  . sodium chloride 10 mL/hr at 04/04/13 0700  . sodium chloride 20 mL/hr at 04/04/13 0700  . sodium chloride 20 mL/hr at 04/04/13 0700  . heparin 1,000 Units/hr (04/04/13 0700)  . insulin (NOVOLIN-R) infusion Stopped (04/03/13 0006)  . norepinephrine (LEVOPHED) Adult infusion Stopped (04/03/13 1300)  .  sodium bicarbonate  infusion 1000 mL Stopped  (04/03/13 1200)    Scheduled Medications: . albuterol  2.5 mg Nebulization Q6H  . ampicillin-sulbactam (UNASYN) IV  1.5 g Intravenous Q6H  . antiseptic oral rinse  15 mL Mouth Rinse QID  . aspirin  81 mg Oral Daily  . chlorhexidine  15 mL Mouth Rinse BID  . clopidogrel  75 mg Oral Q breakfast  . insulin aspart  2-6 Units Subcutaneous Q4H  . pantoprazole (PROTONIX) IV  40 mg Intravenous Q24H  . sodium chloride  3 mL Intravenous Q12H    Assessment/ Plan:      Cardiac arrest:    Cath shows occluded graft to RCA and a tight stenosis of the graft to OM.  He is off pressors - hr and BP are up.    Plans per PCCM  Erythrocytosis:  I suspect that he has sleep apnea    Acute respiratory failure with hypoxia- plans per PCCM  Neuro:  Agree with neuro consult.  EEG.    Acute renal failure   Oliguria   Encephalopathy acute   Disposition:   Length of Stay: 3  Vesta Mixer, Montez Hageman., MD, [facility] 04/04/2013, 9:53 AM Office [phone] Pager [phone]

## 2013-04-04 NOTE — Progress Notes (Signed)
ANTICOAGULATION CONSULT NOTE - Follow Up Consult  Pharmacy Consult for Heparin Indication: MI  Allergies  Allergen Reactions  . Other Hives and Swelling    "Unknown medication from 30 years ago.  He has had various IV and Pain medications from previous performed procedures with no problems.  Given benadryl to counteract reaction-per patient's family."    Patient Measurements: Height: 5\' 10"  (177.8 cm) Weight: 316 lb 2.6 oz (143.41 kg) IBW/kg (Calculated) : 73 Heparin Dosing Weight: 107 kg  Vital Signs: Temp: 98.4 F (36.9 C) (09/11 0800) Temp src: Core (Comment) (09/11 0200) BP: 112/63 mmHg (09/11 0737) Pulse Rate: 100 (09/11 0900)  Labs:  Recent Labs  04/17/2013 1735  04/03/2013 2000  03/27/2013 2245  04/02/13 0405 04/02/13 0406  04/02/13 1230  04/03/13 0425 04/03/13 0916 04/03/13 1319 04/04/13 0425  HGB 17.1*  < >  --   --   --   < > 16.1  --   < >  --   < > 14.3 14.6 15.3 12.3*  HCT 49.6  < >  --   --   --   < > 47.1  --   < >  --   < > 41.0 43.0 45.0 36.8*  PLT 235  --   --   --   --   --  238  --   --   --   --  187  --   --  142*  APTT 30  --  29  --   --   --   --  91*  --   --   --   --   --   --   --   LABPROT 15.6*  --  15.8*  --   --   --   --  15.9*  --   --   --   --   --   --   --   INR 1.27  --  1.29  --   --   --   --  1.30  --   --   --   --   --   --   --   HEPARINUNFRC  --   --   --   --   --   < > 0.39  --   --  0.57  --  0.45  --   --  0.24*  CREATININE 1.84*  < > 1.76*  < >  --   < > 1.40*  --   < >  --   < >  --  0.90 1.10 1.56*  CKTOTAL 956*  --   --   --   --   --   --   --   --   --   --   --   --   --   --   CKMB 22.4*  --   --   --   --   --   --   --   --   --   --   --   --   --   --   TROPONINI 1.58*  --   --   --  2.48*  --  3.01*  --   --   --   --   --   --   --   --   < > = values in this interval not displayed.  Estimated Creatinine Clearance: 75.7 ml/min (by C-G formula based on Cr  of 1.56).   Assessment: 57 yo M with history CABG  admitted 04/16/2013 on transfer from [facility] post cardiac arrest (>40 min) with bystander CPR and AED shock.  PMH: HTN, CAD, Morbid obesity, HLD, CABG '03,  Anticoag: s/p MI on hypothermia protocol. Heparin level 0.24. Hgb 12.3 down. Plts 142 down slightly  ID: Aspiration PNA on Unasyn #3. Cooled on hypothermia. WBC 33.1>>14.5>> 8.5 now. 9/8 BC x 1: pending  CV: CAD, HLD, CABG '03, ICM, Cardiogenic shock. Post-cath hypothermia protocol. Rewarmed 9/10. Meds:ASA, plavix, Norepi drip --Cath: Severe native 3VCAD with total occlusion of LMain and RCA, severe SVG stenosis, EF 20%  Endocrinology: No h/o DM. Hyperglycemia with CBG 102-124 on SSI, A1C 6.4.  Gastrointestinal / Nutrition: morbid obesity, shock liver with ALT 65 down, IV PPI  Neurology: Sedation off. Opens eyes, does not follow commands 9/11. Plan EEG  Nephrology: ARF, UOP now 0.2/24h, metabolic acidosis on bicarb drip. Scr 1.56 up.    Pulmonary: VDRF s/p cardiac arrest, pulmonary edema  Hematology / Oncology  PTA Medication Issues  Best Practices: mouth care, IV PPI, IV heparin   Goal of Therapy:  Heparin level 0.3-0.7 units/ml Monitor platelets by anticoagulation protocol: Yes  Plan:  Increase heparin infusion per protocol to 1300 units/hr    Alyana Kreiter S. Merilynn Finland, PharmD, BCPS Clinical Staff Pharmacist Pager [phone]  Misty Stanley Stillinger 04/04/2013,10:02 AM

## 2013-04-04 NOTE — Procedures (Signed)
EEG report.  Brief clinical history: 57 years old male status post out of hospital cardiac arrest and profound comatose state.  Technique: this is a 17 channel routine scalp EEG performed at the bedside in the ICU setting, with bipolar and monopolar montages arranged in accordance to the international 10/20 system of electrode placement. One channel was dedicated to EKG recording.  The study was performed during wakefulness, drowsiness, and stage 2 sleep. Patient is intubated on the vent but off sedatives.  Description: as the tracing begins and throughout the entire recording, there is diffuse, continuous, monomorphic and unreactive delta-theta slowing that doesn't follow an ictal ictal.  No focal or generalized epileptiform discharges noted. No electrographic seizures.  EKG showed sinus rhythm.  Impression: this is an abnormal EEG with findings consistent with a severe encephalopathy, most likely resulting from patient's recent catastrophic anoxic brain injury. No electrographic seizures noted. Clinical correlation is advised.  Wyatt Portela, MD

## 2013-04-04 NOTE — Progress Notes (Signed)
EEG Completed; Results Pending  

## 2013-04-04 NOTE — Progress Notes (Signed)
PULMONARY  / CRITICAL CARE MEDICINE  Name: ETHERIDGE GEIL MRN: [id number] DOB: 1955-11-28    ADMISSION DATE:  04/17/2013  REFERRING MD :  [facility]  PRIMARY SERVICE: PCCM  CHIEF COMPLAINT:  Cardiac Arrest  BRIEF PATIENT DESCRIPTION: 57 y/o, morbidly obese male admitted 9/8 from [facility] post cardiac arrest (>40 min).    SIGNIFICANT EVENTS / STUDIES:  9/08 - arrest at work, initial rhythm shock with AED, 40 mins CPR.  Intubated by EMS with #7 9/8 CATH : severe 3VD. Severe stenosis SVG OM1-OM2, total RCA, patent LIMA to LAD, EF 20%.   LINES / TUBES: OETT #7 9/8>>> R [place] CVL 9/8>>  CULTURES: Sputum 9/8>>> BCx1  9/8>>> UA 9/8>>> UDS 9/8>>>  ANTIBIOTICS: Unasyn (asp) 9/8>>>  SUBJECTIVE:  Sedative and paralytics are off, patient completely unresponsive.  VITAL SIGNS: Temp:  [96.8 F (36 C)-99.5 F (37.5 C)] 98.4 F (36.9 C) (09/11 0800) Pulse Rate:  [100-160] 100 (09/11 0900) Resp:  [13-24] 24 (09/11 0900) BP: (92-147)/(55-85) 112/63 mmHg (09/11 0737) SpO2:  [94 %-100 %] 95 % (09/11 0900) Arterial Line BP: (83-186)/(51-105) 119/62 mmHg (09/11 0900) FiO2 (%):  [40 %-60 %] 40 % (09/11 0800)  HEMODYNAMICS: CVP:  [15 mmHg-30 mmHg] 22 mmHg On Norepi  VENTILATOR SETTINGS: Vent Mode:  [-] PRVC FiO2 (%):  [40 %-60 %] 40 % Set Rate:  [16 bmp-24 bmp] 16 bmp Vt Set:  [580 mL] 580 mL PEEP:  [5 cmH20] 5 cmH20 Plateau Pressure:  [16 cmH20-29 cmH20] 24 cmH20  INTAKE / OUTPUT: Intake/Output     09/10 0701 - 09/11 0700 09/11 0701 - 09/12 0700   I.V. (mL/kg) 2086.7 (14.6) 127.2 (0.9)   NG/GT 100 60   IV Piggyback 150    Total Intake(mL/kg) 2336.7 (16.3) 187.2 (1.3)   Urine (mL/kg/hr) 680 (0.2) 100 (0.2)   Emesis/NG output 250 (0.1)    Total Output 930 100   Net +1406.7 +87.2         PHYSICAL EXAMINATION: General:  Morbidly obese male on vent, unresponsive. Neuro: Off sedation, not moving any ext to command or pain, no gag, no corneal and no dolls eye  reflex. HEENT:  Mm pink/moist, short thick neck, OETT Cardiovascular:  s1s2 rrr, distant tones Lungs:  Scattered rhonchi Abdomen:  Obese /soft, bsx4 hypoactive Musculoskeletal:  No acute deformities Skin:  Warm/dry, abrasions on knees  LABS:  CBC Recent Labs     04/02/13  0405   04/03/13  0425  04/03/13  0916  04/03/13  1319  04/04/13  0425  WBC  22.1*   --   14.5*   --    --   8.5  HGB  16.1   < >  14.3  14.6  15.3  12.3*  HCT  47.1   < >  41.0  43.0  45.0  36.8*  PLT  238   --   187   --    --   142*   < > = values in this interval not displayed.   Coag's Recent Labs     04/18/2013  1735  03/27/2013  2000  04/02/13  0406  APTT  30  29  91*  INR  1.27  1.29  1.30   BMET Recent Labs     04/08/2013  2208   04/02/13  0405   04/03/13  0916  04/03/13  1319  04/04/13  0425  NA  134*   < >  137   < >  140  140  140  K  4.2   < >  3.4*   < >  3.4*  4.5  4.5  CL  97   < >  99   < >  102  103  103  CO2  17*   --   15*   --    --    --   25  BUN  18   < >  19   < >  9  11  24*  CREATININE  1.64*   < >  1.40*   < >  0.90  1.10  1.56*  GLUCOSE  221*   < >  256*   < >  159*  143*  131*   < > = values in this interval not displayed.   Electrolytes Recent Labs     03/30/2013  2208  04/02/13  0405  04/04/13  0425  CALCIUM  7.8*  8.0*  8.0*  MG   --    --   1.7  PHOS   --    --   2.9   Sepsis Markers No results found for this basename: LACTICACIDVEN, PROCALCITON, O2SATVEN,  in the last 72 hours  ABG Recent Labs     04/14/2013  1657  04/02/13  0358  04/04/13  0500  PHART  7.302*  7.302*  7.495*  PCO2ART  50.2*  30.7*  32.6*  PO2ART  217.0*  104.0*  103.0*   Liver Enzymes Recent Labs     04/13/2013  1735  04/02/13  0405  04/03/13  0425  AST  109*  108*  118*  ALT  81*  73*  65*  ALKPHOS  77  51  42  BILITOT  0.6  0.6  0.7  ALBUMIN  3.5  3.1*  2.7*   Cardiac Enzymes Recent Labs     03/31/2013  1735  04/18/2013  2245  04/02/13  0405  TROPONINI  1.58*  2.48*   3.01*   Glucose Recent Labs     04/03/13  0824  04/03/13  1230  04/03/13  1933  04/03/13  2335  04/04/13  0424  04/04/13  0754  GLUCAP  126*  105*  107*  113*  124*  102*   Imaging  CXR: 9/9: pulm edema, stable support apparatus  ASSESSMENT / PLAN:  Principal Problem:   Cardiac arrest Active Problems:   Acute respiratory failure with hypoxia   Pulmonary edema   Cardiogenic shock   Acute renal failure   Oliguria   Encephalopathy acute   Cardiomyopathy, ischemic   Hyperglycemia   Shock liver   Metabolic acidosis   Aspiration into lower respiratory tract   PULMONARY A: Acute Respiratory Failure - in setting of cardiac arrest Pulmonary edema Possible Aspiration  P:   - Cont full vent support, titrate fiO2 to support sats >92%, decrease FiO2 to 40%. - 8cc/kg= 580cc. - F/u cxr in am - Scheduled  albuterol  - Hold weaning given mental status.  CARDIOVASCULAR A:  Cardiac Arrest - initial rhythm shocked via AED.  First EMS witnessed rhythm asystole.  >40 minutes CPR Cardiogenic Shock - post arrest  Hx of CAD and pCABG CATH 9/8: severe 3VD. Severe stenosis SVG OM1-OM2, total RCA, patent LIMA to LAD, EF 20%. P:  - Appreciate cardiology input. - Hold pressors for now. - No further cardiac work up until neuro status is more addressed.  RENAL A:   Acute renal insufficiency Oliguria  Worsening renal function likely related to hypotension with diprivan use (patient likely used to higher perfusion pressure). Mixed Acidosis persists P:   - D/C bicarb drip. - No further K replacement. - BMET in AM. - D/C diprivan and allow for higher perfusion pressure.  GASTROINTESTINAL A:   Shock liver , elevated AST ALT P:   - Consult nutrition for TF as per nutrition. - PPI   HEMATOLOGIC A:   P:  - Assess CBC daily. - Heparin for DVT proph / SCD's.  INFECTIOUS A:   Purulent sputum - suspect aspiration  Fever - 100.6 on arrival to [facility] UDS neg  P:   - Cont empiric  unasyn for ? Aspiration. - Assess BCx1 (only able to get one set) NTD.  ENDOCRINE A:   Hyperglycemia with no prior hx of DM P:   - ICU SSI protocol - Needed to start insulin drip am 9/8, will reevaluate if TF are to be started.  NEUROLOGIC A: Post anoxic acute encephalopathy P:   - CT of the head. - EEG. - Neurology consult.  TODAYS SUMMARY: Spoke with family, informed that patient is showing signs of significant brain injury but would like to get neurology involved and perform further neurologic testing prior to final recommendations but did recommend full DNR status.  They are currently discussing that.  Neuro consult called, EEG and CT ordered.   I have personally obtained a history, examined the patient, evaluated laboratory and imaging results, formulated the assessment and plan and placed orders.  CRITICAL CARE: The patient is critically ill with multiple organ systems failure and requires high complexity decision making for assessment and support, frequent evaluation and titration of therapies, application of advanced monitoring technologies and extensive interpretation of multiple databases. Critical Care Time devoted to patient care services described in this note is 40 minutes.   Alyson Reedy, M.D. [facility] Pulmonary/Critical Care Medicine. Pager: [phone]. After hours pager: [phone].

## 2013-04-05 ENCOUNTER — Inpatient Hospital Stay (HOSPITAL_COMMUNITY): Payer: BC Managed Care – PPO

## 2013-04-05 DIAGNOSIS — G931 Anoxic brain damage, not elsewhere classified: Secondary | ICD-10-CM

## 2013-04-05 LAB — CBC
HCT: 38.4 % — ABNORMAL LOW (ref 39.0–52.0)
MCHC: 32.8 g/dL (ref 30.0–36.0)
Platelets: 170 10*3/uL (ref 150–400)
RDW: 15.2 % (ref 11.5–15.5)
WBC: 13.5 10*3/uL — ABNORMAL HIGH (ref 4.0–10.5)

## 2013-04-05 LAB — BLOOD GAS, ARTERIAL
Acid-Base Excess: 0.7 mmol/L (ref 0.0–2.0)
Bicarbonate: 25.7 mEq/L — ABNORMAL HIGH (ref 20.0–24.0)
Drawn by: 29017
FIO2: 0.5 %
O2 Saturation: 99.2 %
RATE: 16 resp/min
pCO2 arterial: 48.2 mmHg — ABNORMAL HIGH (ref 35.0–45.0)
pO2, Arterial: 115 mmHg — ABNORMAL HIGH (ref 80.0–100.0)

## 2013-04-05 LAB — BASIC METABOLIC PANEL
BUN: 33 mg/dL — ABNORMAL HIGH (ref 6–23)
Chloride: 104 mEq/L (ref 96–112)
GFR calc Af Amer: 67 mL/min — ABNORMAL LOW (ref >90)
GFR calc non Af Amer: 58 mL/min — ABNORMAL LOW (ref >90)
Potassium: 4.4 mEq/L (ref 3.5–5.1)
Sodium: 141 mEq/L (ref 135–145)

## 2013-04-05 LAB — PHOSPHORUS: Phosphorus: 3.9 mg/dL (ref 2.3–4.6)

## 2013-04-05 LAB — GLUCOSE, CAPILLARY
Glucose-Capillary: 111 mg/dL — ABNORMAL HIGH (ref 70–99)
Glucose-Capillary: 122 mg/dL — ABNORMAL HIGH (ref 70–99)

## 2013-04-05 LAB — MAGNESIUM: Magnesium: 2.2 mg/dL (ref 1.5–2.5)

## 2013-04-05 LAB — HEPARIN LEVEL (UNFRACTIONATED): Heparin Unfractionated: 0.15 IU/mL — ABNORMAL LOW (ref 0.30–0.70)

## 2013-04-05 MED ORDER — MORPHINE SULFATE 25 MG/ML IV SOLN
10.0000 mg/h | INTRAVENOUS | Status: DC
  Administered 2013-04-05: 10 mg/h via INTRAVENOUS
  Filled 2013-04-05: qty 10

## 2013-04-05 MED ORDER — SODIUM CHLORIDE 0.9 % IV SOLN
10.0000 mg/h | INTRAVENOUS | Status: DC
  Administered 2013-04-05: 10 mg/h via INTRAVENOUS
  Filled 2013-04-05: qty 10

## 2013-04-05 MED ORDER — MORPHINE BOLUS VIA INFUSION
5.0000 mg | INTRAVENOUS | Status: DC | PRN
  Administered 2013-04-05 (×2): 10 mg via INTRAVENOUS
  Filled 2013-04-05: qty 20

## 2013-04-05 MED ORDER — MIDAZOLAM BOLUS VIA INFUSION
5.0000 mg | INTRAVENOUS | Status: DC | PRN
  Administered 2013-04-05: 10 mg via INTRAVENOUS
  Filled 2013-04-05: qty 20

## 2013-04-05 MED ORDER — HEPARIN BOLUS VIA INFUSION
3000.0000 [IU] | Freq: Once | INTRAVENOUS | Status: AC
  Administered 2013-04-05: 3000 [IU] via INTRAVENOUS
  Filled 2013-04-05: qty 3000

## 2013-04-08 LAB — CULTURE, BLOOD (ROUTINE X 2)

## 2013-04-10 ENCOUNTER — Encounter: Payer: Self-pay | Admitting: Cardiovascular Disease

## 2013-04-11 NOTE — Discharge Summary (Signed)
Lee White, Lee White                 ACCOUNT NO.:  [id number]  MEDICAL RECORD NO.:  [id number]  LOCATION:  2H02C                        FACILITY:  [facility]  PHYSICIAN:  Felipa Evener, MD  DATE OF BIRTH:  Jun 02, 1956  DATE OF ADMISSION:  04/12/2013 DATE OF DISCHARGE:  03/29/2013                              DISCHARGE SUMMARY   DEATH SUMMARY  PRIMARY DIAGNOSIS/CAUSE OF DEATH:  Pulseless electrical activity, cardiac arrest.  SECONDARY DIAGNOSES:  ST-segment elevation myocardial infarction, coronary artery disease, anoxic brain injury, cardiogenic shock, acute renal failure with oliguria, shocked liver, aspiration pneumonia, and diabetes.  HOSPITAL COURSE:  The patient is a 57 year old male with past medical history significant for coronary artery disease presented to [facility] with cardiac arrest with resuscitative efforts for over 40 minutes.  The patient was brought to 2900 __________.  Hypothermic protocol was completed.  The patient was noted to have significant anoxic injury and was in a persistent vegetative state.  Neurology was consulted, and the same conclusion was reached.  I had extensive conversation with the family, who informed me that the patient would not want to live in this condition at which point, a decision was made to treat the patient full comfort care.  The patient was started on morphine.  ET tube was withdrawn, and the patient expired shortly thereafter with the family at the bedside.     Felipa Evener, MD     WJY/MEDQ  D:  04/10/2013  T:  04/11/2013  Job:  [id number]

## 2013-04-24 NOTE — Progress Notes (Signed)
No heart rate or respiratory effort for 2 minutes (verified by 2 RNs).  Monitor shows asystole and apnea.  Pupils 6 cm dilated without response.  Family at bedside.  Support given to family.

## 2013-04-24 NOTE — Plan of Care (Signed)
Problem: Phase II Progression Outcomes Goal: Extubated and maintains O2 sats > 92% Outcome: Completed/Met Date Met:  04/17/2013 Comfort care/ DNR

## 2013-04-24 NOTE — Progress Notes (Signed)
Switched mode on ventilator to PS mode with FiO2 50%, PEEP 10cm and PS 5cm. Pt spontaneously triggered 10-12bpm with volumes of 530-560cc's. Returned to full support.

## 2013-04-24 NOTE — Progress Notes (Signed)
ANTICOAGULATION CONSULT NOTE - Follow Up Consult  Pharmacy Consult for heparin Indication: cardiac arrest and MI  Labs:  Recent Labs  04/03/13 0425 04/03/13 0916 04/03/13 1319 04/04/13 0425 04/22/2013 0445  HGB 14.3 14.6 15.3 12.3* 12.6*  HCT 41.0 43.0 45.0 36.8* 38.4*  PLT 187  --   --  142* 170  HEPARINUNFRC 0.45  --   --  0.24* 0.15*  CREATININE  --  0.90 1.10 1.56*  --     Assessment: 58yo male with lower heparin level despite increased rate.  Awaiting family decision regarding care.  Goal of Therapy:  Heparin level 0.3-0.7 units/ml   Plan:  Will give heparin bolus of 3000 units x1 and increase gtt by 4 units/kg(ABW)/hr to 1700 units/hr and check level in 6hr.  Vernard Gambles, PharmD, BCPS  03/29/2013,5:24 AM

## 2013-04-24 NOTE — Progress Notes (Signed)
   It appears that his chances for a meaningful recovery are nil.  I will not be able to attend the meeting at 9 AM. I would be in support of a terminal wean.  I was able to talk to [place] this am  Alvia Grove., MD, [facility] 03/28/2013, 7:34 AM Office - [phone] Pager [phone]

## 2013-04-24 NOTE — Procedures (Signed)
Extubation Procedure Note  Patient Details:   Name: Lee White DOB: 12-08-1955 MRN: [id number]   Airway Documentation:   Patient was extubated at this time with DNR order in place.  Evaluation  O2 sats: stable throughout Complications: No apparent complications Patient did tolerate procedure well. Bilateral Breath Sounds: Clear;Coarse crackles Suctioning: Airway No  Clearance Coots 03/28/2013, 10:55 AM

## 2013-04-24 NOTE — Progress Notes (Signed)
PULMONARY  / CRITICAL CARE MEDICINE  Name: BURNETT SPRAY MRN: [id number] DOB: 09-Mar-1956    ADMISSION DATE:  04/09/2013  REFERRING MD :  [facility]  PRIMARY SERVICE: PCCM  CHIEF COMPLAINT:  Cardiac Arrest  BRIEF PATIENT DESCRIPTION: 57 y/o, morbidly obese male admitted 9/8 from [facility] post cardiac arrest (>40 min).    SIGNIFICANT EVENTS / STUDIES:  9/08 - arrest at work, initial rhythm shock with AED, 40 mins CPR.  Intubated by EMS with #7 9/8 CATH : severe 3VD. Severe stenosis SVG OM1-OM2, total RCA, patent LIMA to LAD, EF 20%.   LINES / TUBES: OETT #7 9/8>>> R [place] CVL 9/8>>  CULTURES: Sputum 9/8>>> BCx1  9/8>>> UA 9/8>>> UDS 9/8>>>  ANTIBIOTICS: Unasyn (asp) 9/8>>>  SUBJECTIVE:  Sedative and paralytics are off, patient completely unresponsive.  VITAL SIGNS: Temp:  [98.7 F (37.1 C)-99.9 F (37.7 C)] 99.9 F (37.7 C) (09/12 0800) Pulse Rate:  [97-124] 112 (09/12 0900) Resp:  [13-23] 14 (09/12 0900) BP: (115-140)/(62-75) 135/75 mmHg (09/12 0833) SpO2:  [93 %-98 %] 97 % (09/12 0900) Arterial Line BP: (117-195)/(72-121) 137/77 mmHg (09/12 0900) FiO2 (%):  [50 %] 50 % (09/12 0834) Weight:  [144.5 kg (318 lb 9 oz)] 144.5 kg (318 lb 9 oz) (09/12 0500)  HEMODYNAMICS: CVP:  [21 mmHg-26 mmHg] 22 mmHg On Norepi  VENTILATOR SETTINGS: Vent Mode:  [-] PRVC FiO2 (%):  [50 %] 50 % Set Rate:  [16 bmp] 16 bmp Vt Set:  [580 mL] 580 mL PEEP:  [10 cmH20] 10 cmH20 Plateau Pressure:  [20 cmH20-32 cmH20] 32 cmH20  INTAKE / OUTPUT: Intake/Output     09/11 0701 - 09/12 0700 09/12 0701 - 09/13 0700   I.V. (mL/kg) 1951.5 (13.5) 204 (1.4)   NG/GT 707.5 210   IV Piggyback 150    Total Intake(mL/kg) 2809 (19.4) 414 (2.9)   Urine (mL/kg/hr) 1640 (0.5) 200 (0.4)   Emesis/NG output     Total Output 1640 200   Net +1169 +214         PHYSICAL EXAMINATION: General:  Morbidly obese male on vent, unresponsive. Neuro: Off sedation, not moving any ext to command  or pain, no gag, no corneal and no dolls eye reflex. HEENT:  Mm pink/moist, short thick neck, OETT Cardiovascular:  s1s2 rrr, distant tones Lungs:  Scattered rhonchi Abdomen:  Obese /soft, bsx4 hypoactive Musculoskeletal:  No acute deformities Skin:  Warm/dry, abrasions on knees  LABS:  CBC Recent Labs     04/03/13  0425   04/03/13  1319  04/04/13  0425  05/02/2013  0445  WBC  14.5*   --    --   8.5  13.5*  HGB  14.3   < >  15.3  12.3*  12.6*  HCT  41.0   < >  45.0  36.8*  38.4*  PLT  187   --    --   142*  170   < > = values in this interval not displayed.   Coag's No results found for this basename: APTT, INR,  in the last 72 hours BMET Recent Labs     04/03/13  1319  04/04/13  0425  2013-05-02  0445  NA  140  140  141  K  4.5  4.5  4.4  CL  103  103  104  CO2   --   25  26  BUN  11  24*  33*  CREATININE  1.10  1.56*  1.34  GLUCOSE  143*  131*  118*   Electrolytes Recent Labs     04/04/13  0425  04/22/2013  0445  CALCIUM  8.0*  8.6  MG  1.7  2.2  PHOS  2.9  3.9   Sepsis Markers No results found for this basename: LACTICACIDVEN, PROCALCITON, O2SATVEN,  in the last 72 hours  ABG Recent Labs     04/04/13  0500  04/23/2013  0442  PHART  7.495*  7.346*  PCO2ART  32.6*  48.2*  PO2ART  103.0*  115.0*   Liver Enzymes Recent Labs     04/03/13  0425  AST  118*  ALT  65*  ALKPHOS  42  BILITOT  0.7  ALBUMIN  2.7*   Cardiac Enzymes No results found for this basename: TROPONINI, PROBNP,  in the last 72 hours Glucose Recent Labs     04/04/13  1225  04/04/13  1732  04/04/13  1949  04/04/2013  0035  04/04/2013  0449  04/15/2013  0824  GLUCAP  136*  135*  119*  111*  110*  122*   Imaging  CXR: 9/9: pulm edema, stable support apparatus  ASSESSMENT / PLAN:  Principal Problem:   Cardiac arrest Active Problems:   Acute respiratory failure with hypoxia   Pulmonary edema   Cardiogenic shock   Acute renal failure   Oliguria   Encephalopathy acute    Cardiomyopathy, ischemic   Hyperglycemia   Shock liver   Metabolic acidosis   Aspiration into lower respiratory tract   Anoxic encephalopathy   PULMONARY A: Acute Respiratory Failure - in setting of cardiac arrest Pulmonary edema Possible Aspiration  P:   - Comfort and withdrawal.  CARDIOVASCULAR A:  Cardiac Arrest - initial rhythm shocked via AED.  First EMS witnessed rhythm asystole.  >40 minutes CPR Cardiogenic Shock - post arrest  Hx of CAD and pCABG CATH 9/8: severe 3VD. Severe stenosis SVG OM1-OM2, total RCA, patent LIMA to LAD, EF 20%. P:  - D/C anti-HTN and begin morphine and versed.  RENAL A:   Acute renal insufficiency Oliguria Worsening renal function likely related to hypotension with diprivan use (patient likely used to higher perfusion pressure). Mixed Acidosis persists P:   - D/C bicarb drip. - Full comfort care.  GASTROINTESTINAL A:   Shock liver , elevated AST ALT P:   - D/C TF.   HEMATOLOGIC A:   P:  - D/C further blood draws.  INFECTIOUS A:   Purulent sputum - suspect aspiration  Fever - 100.6 on arrival to [facility] UDS neg  P:   - D/C abx.  ENDOCRINE A:   Hyperglycemia with no prior hx of DM P:   - D/C CBG. - D/C insulin.  NEUROLOGIC A: Post anoxic acute encephalopathy, severe. P:   - Morphine and versed for comfort.  TODAYS SUMMARY: Spoke with family extensively today, at this point, they are ready for withdrawal of life supporting measures as they understand that chances at meaningful recovery are not there.  They are only asking that patient be comfortable.  Will place withdrawal order set with morphine and versed and allow for patient to pass away comfortably.   I have personally obtained a history, examined the patient, evaluated laboratory and imaging results, formulated the assessment and plan and placed orders.  CRITICAL CARE: The patient is critically ill with multiple organ systems failure and requires high complexity  decision making for assessment and support, frequent evaluation and titration of therapies, application  of advanced monitoring technologies and extensive interpretation of multiple databases. Critical Care Time devoted to patient care services described in this note is 35 minutes.   Rush Farmer, M.D. [facility] Pulmonary/Critical Care Medicine. Pager: [phone]. After hours pager: [phone].

## 2013-04-24 NOTE — Progress Notes (Signed)
Chaplain received a spiritual consult and went to [facility] to meet with patient and family.  Upon arrival, chaplain met with the family pastor who informed him of the patient's passing.  It was decided that in that current moment, having their family pastor present to provide spiritual and grief support was probably in the best interest of the family.  Chaplain ended the visit soon thereafter.     04/22/2013 1100  Clinical Encounter Type  Visited With Other (Comment) Renato Gails)  Visit Type Spiritual support  Referral From Nurse  Consult/Referral To Chaplain  Spiritual Encounters  Spiritual Needs Emotional  Stress Factors  Patient Stress Factors None identified  Family Stress Factors None identified  [facility] Sheral Apley [phone]

## 2013-04-24 NOTE — Progress Notes (Signed)
100 cc Fentanyl drip (10 mcg per ml), 200 cc Morphine drip (1 mg per ml) and 20 cc Versed (1mg  per ml) wasted in sink followed by water flush by 2 RN

## 2013-04-24 DEATH — deceased

## 2013-08-25 DEATH — deceased

## 2014-06-20 IMAGING — CR DG CHEST 1V PORT
1 series · 1 of 1 positions shown · non-contrast
Comparison: 04/04/2013.

CLINICAL DATA: Endotracheal tube.

PORTABLE CHEST - 1 VIEW

[AP]
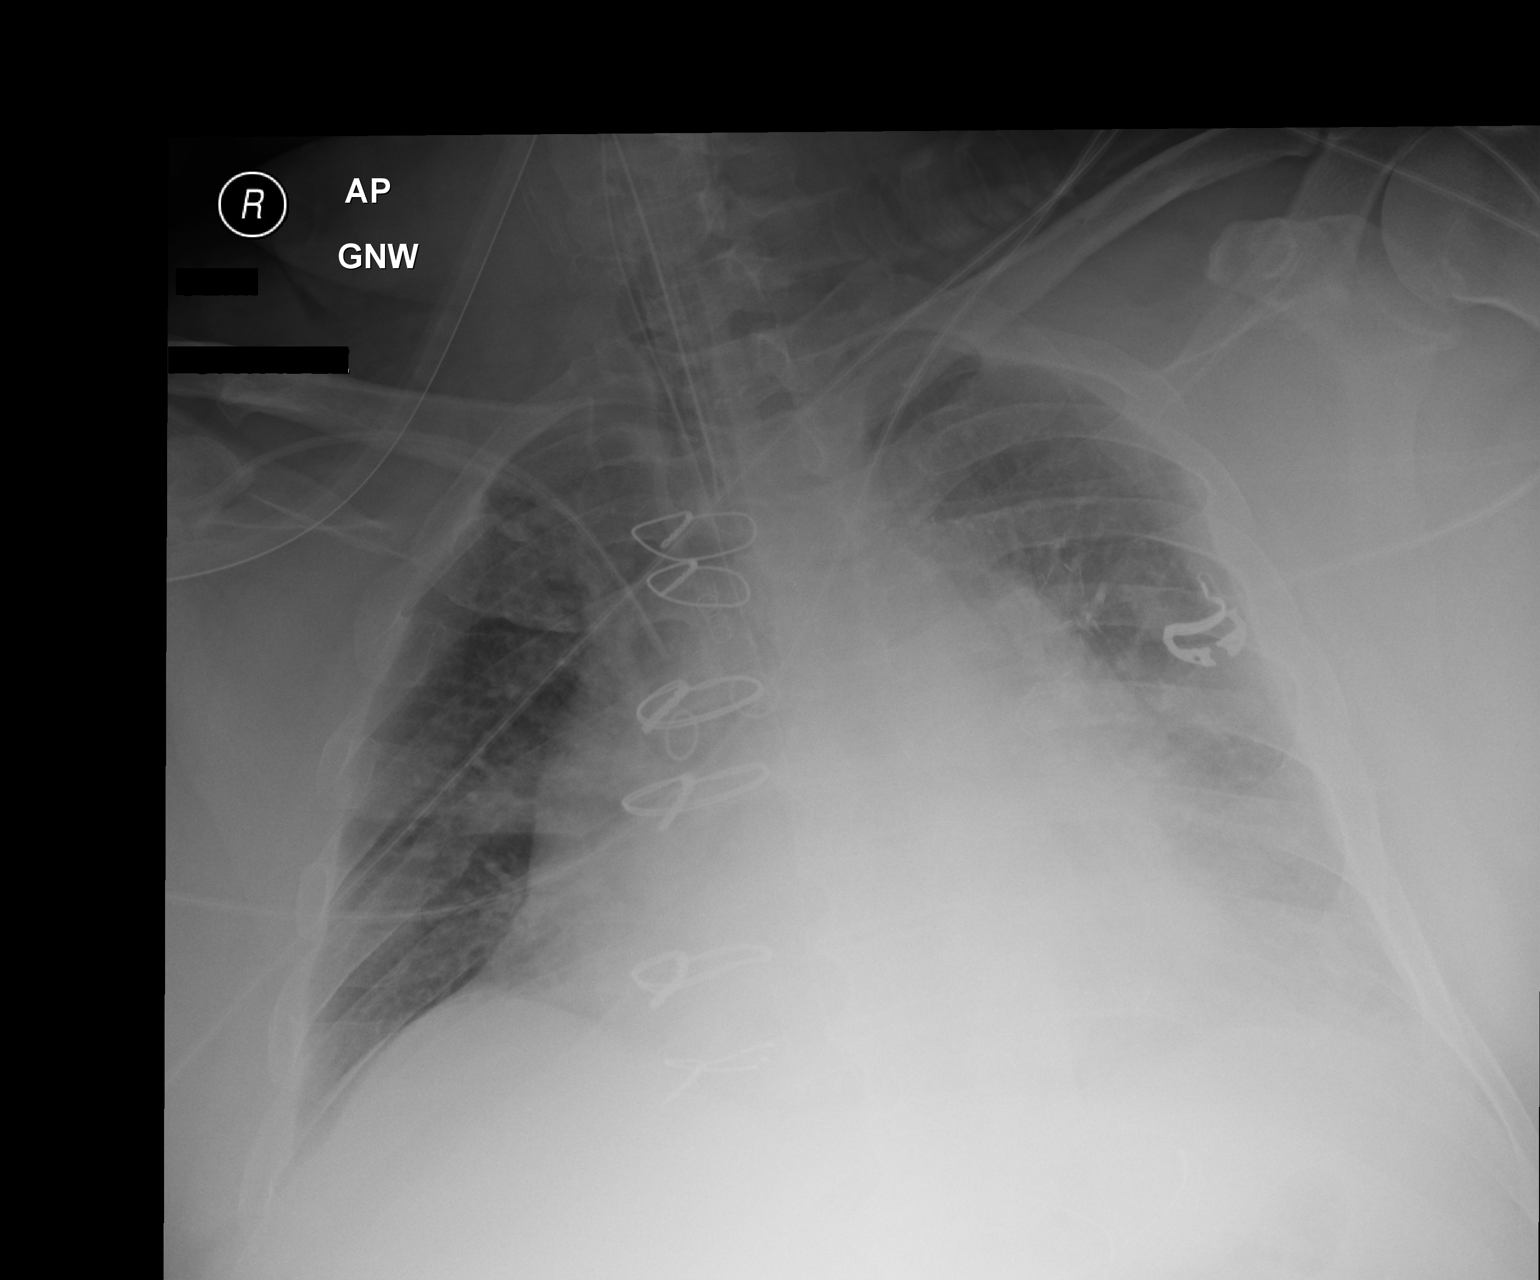

[1 of 1 positions shown; findings below may reference images not displayed]

FINDINGS: Rotation to the right.

Endotracheal tube tip 6 cm above the carina.

Right central line tip proximal superior vena cava level.

Nasogastric tube is in place with the tip at the level of the
gastric fundus.

Post CABG.  Cardiomegaly.

Pulmonary vascular congestion most notable centrally.

Post removal left sided chest tube.  No gross pneumothorax.

Hazy appearance of left mid to lower lung zone may be related to
overlying structures.  Limited for evaluating for underlying
infiltrate or atelectasis.
IMPRESSION: Post CABG.  Cardiomegaly.

Pulmonary vascular congestion most notable centrally.

Post removal left sided chest tube.  No gross pneumothorax.

Hazy appearance of left mid to lower lung zone may be related to
overlying structures.  Limited for evaluating for underlying
infiltrate or atelectasis.

## 2014-07-03 ENCOUNTER — Encounter (HOSPITAL_COMMUNITY): Payer: Self-pay | Admitting: Cardiovascular Disease
